# Patient Record
Sex: Female | Born: 1955 | Race: Black or African American | Hispanic: No | Smoking: Never smoker
Health system: Southern US, Community
[De-identification: ages and names within clinical notes are randomized; demographics above are authoritative.]

## PROBLEM LIST (undated history)

## (undated) DIAGNOSIS — E119 Type 2 diabetes mellitus without complications: Secondary | ICD-10-CM

## (undated) DIAGNOSIS — I10 Essential (primary) hypertension: Secondary | ICD-10-CM

## (undated) DIAGNOSIS — E785 Hyperlipidemia, unspecified: Secondary | ICD-10-CM

## (undated) HISTORY — PX: CHOLECYSTECTOMY: SHX55

---

## 1999-10-11 ENCOUNTER — Encounter: Payer: Self-pay | Admitting: Family Medicine

## 1999-10-11 ENCOUNTER — Ambulatory Visit (HOSPITAL_COMMUNITY): Admission: RE | Admit: 1999-10-11 | Discharge: 1999-10-11 | Payer: Self-pay | Admitting: Family Medicine

## 2000-08-31 ENCOUNTER — Ambulatory Visit (HOSPITAL_COMMUNITY): Admission: RE | Admit: 2000-08-31 | Discharge: 2000-08-31 | Payer: Self-pay | Admitting: Family Medicine

## 2000-08-31 ENCOUNTER — Encounter: Payer: Self-pay | Admitting: Family Medicine

## 2000-09-24 ENCOUNTER — Other Ambulatory Visit: Admission: RE | Admit: 2000-09-24 | Discharge: 2000-09-24 | Payer: Self-pay | Admitting: Family Medicine

## 2000-10-21 ENCOUNTER — Ambulatory Visit (HOSPITAL_COMMUNITY): Admission: RE | Admit: 2000-10-21 | Discharge: 2000-10-21 | Payer: Self-pay | Admitting: Family Medicine

## 2000-10-21 ENCOUNTER — Encounter: Payer: Self-pay | Admitting: Family Medicine

## 2001-10-28 ENCOUNTER — Ambulatory Visit (HOSPITAL_COMMUNITY): Admission: RE | Admit: 2001-10-28 | Discharge: 2001-10-28 | Payer: Self-pay | Admitting: Family Medicine

## 2001-10-28 ENCOUNTER — Encounter: Payer: Self-pay | Admitting: Family Medicine

## 2001-11-27 ENCOUNTER — Other Ambulatory Visit: Admission: RE | Admit: 2001-11-27 | Discharge: 2001-11-27 | Payer: Self-pay | Admitting: Family Medicine

## 2002-11-10 ENCOUNTER — Ambulatory Visit (HOSPITAL_COMMUNITY): Admission: RE | Admit: 2002-11-10 | Discharge: 2002-11-10 | Payer: Self-pay | Admitting: Family Medicine

## 2002-11-10 ENCOUNTER — Encounter: Payer: Self-pay | Admitting: Family Medicine

## 2002-12-30 ENCOUNTER — Encounter: Payer: Self-pay | Admitting: Family Medicine

## 2002-12-30 ENCOUNTER — Encounter: Admission: RE | Admit: 2002-12-30 | Discharge: 2002-12-30 | Payer: Self-pay | Admitting: Family Medicine

## 2002-12-30 ENCOUNTER — Encounter (INDEPENDENT_AMBULATORY_CARE_PROVIDER_SITE_OTHER): Payer: Self-pay | Admitting: Specialist

## 2003-01-01 HISTORY — PX: BREAST BIOPSY: SHX20

## 2003-05-05 ENCOUNTER — Other Ambulatory Visit: Admission: RE | Admit: 2003-05-05 | Discharge: 2003-05-05 | Payer: Self-pay | Admitting: Family Medicine

## 2003-11-30 ENCOUNTER — Ambulatory Visit (HOSPITAL_COMMUNITY): Admission: RE | Admit: 2003-11-30 | Discharge: 2003-11-30 | Payer: Self-pay | Admitting: Family Medicine

## 2003-12-10 ENCOUNTER — Encounter: Admission: RE | Admit: 2003-12-10 | Discharge: 2003-12-10 | Payer: Self-pay | Admitting: Family Medicine

## 2004-11-08 ENCOUNTER — Other Ambulatory Visit: Admission: RE | Admit: 2004-11-08 | Discharge: 2004-11-08 | Payer: Self-pay | Admitting: Family Medicine

## 2005-01-02 ENCOUNTER — Ambulatory Visit (HOSPITAL_COMMUNITY): Admission: RE | Admit: 2005-01-02 | Discharge: 2005-01-02 | Payer: Self-pay | Admitting: Family Medicine

## 2005-09-27 ENCOUNTER — Encounter (INDEPENDENT_AMBULATORY_CARE_PROVIDER_SITE_OTHER): Payer: Self-pay | Admitting: Specialist

## 2005-09-27 ENCOUNTER — Ambulatory Visit (HOSPITAL_COMMUNITY): Admission: EM | Admit: 2005-09-27 | Discharge: 2005-09-28 | Payer: Self-pay | Admitting: Emergency Medicine

## 2006-01-03 ENCOUNTER — Ambulatory Visit (HOSPITAL_COMMUNITY): Admission: RE | Admit: 2006-01-03 | Discharge: 2006-01-03 | Payer: Self-pay | Admitting: Family Medicine

## 2006-05-23 ENCOUNTER — Other Ambulatory Visit: Admission: RE | Admit: 2006-05-23 | Discharge: 2006-05-23 | Payer: Self-pay | Admitting: Family Medicine

## 2007-01-06 ENCOUNTER — Encounter: Admission: RE | Admit: 2007-01-06 | Discharge: 2007-01-06 | Payer: Self-pay | Admitting: Family Medicine

## 2007-04-30 ENCOUNTER — Encounter: Admission: RE | Admit: 2007-04-30 | Discharge: 2007-04-30 | Payer: Self-pay | Admitting: Family Medicine

## 2008-01-06 ENCOUNTER — Ambulatory Visit (HOSPITAL_COMMUNITY): Admission: RE | Admit: 2008-01-06 | Discharge: 2008-01-06 | Payer: Self-pay | Admitting: Family Medicine

## 2009-01-06 ENCOUNTER — Ambulatory Visit (HOSPITAL_COMMUNITY): Admission: RE | Admit: 2009-01-06 | Discharge: 2009-01-06 | Payer: Self-pay | Admitting: Family Medicine

## 2010-04-04 ENCOUNTER — Ambulatory Visit (HOSPITAL_COMMUNITY): Admission: RE | Admit: 2010-04-04 | Discharge: 2010-04-04 | Payer: Self-pay | Admitting: Family Medicine

## 2010-11-12 ENCOUNTER — Encounter: Payer: Self-pay | Admitting: Family Medicine

## 2010-12-27 ENCOUNTER — Other Ambulatory Visit (HOSPITAL_COMMUNITY): Payer: Self-pay | Admitting: Family Medicine

## 2010-12-27 DIAGNOSIS — Z1231 Encounter for screening mammogram for malignant neoplasm of breast: Secondary | ICD-10-CM

## 2011-03-09 NOTE — H&P (Signed)
NAMEFELISIA, BALCOM          ACCOUNT NO.:  [id number]   MEDICAL RECORD NO.:  [id number]          PATIENT TYPE:  EMS   LOCATION:  ED                           FACILITY:  [facility]   PHYSICIAN:  Ollen Gross. Vernell Morgans, M.D. DATE OF BIRTH:  1956/09/02   DATE OF ADMISSION:  09/27/2005  DATE OF DISCHARGE:                                HISTORY & PHYSICAL   HISTORY OF PRESENT ILLNESS:  [name] is a 55 year old black female who  began having severe right upper quadrant pain last night.  This has been  associated with significant nausea and vomiting.  Her pain has subsided  since getting pain medications, but was severe in nature.  She has had  multiple episodes of this year for the last 10 years.  She has felt that she  has had stones in her gallbladder for the last 10 years.  She otherwise  denies any chest pain, shortness of breath, fevers, chills, diarrhea,  dysuria.  The rest of her review of systems is unremarkable.   PAST MEDICAL HISTORY:  Significant for hypertension, hypercholesterolemia.   PAST SURGICAL HISTORY:  Significant for C-section.   MEDICATIONS:  Diovan and Zyrtec.   ALLERGIES:  No known drug allergies.   SOCIAL HISTORY:  Denies use of alcohol or tobacco products.   FAMILY HISTORY:  Noncontributory.   PHYSICAL EXAMINATION:  VITAL SIGNS:  Temperature 97.4, blood pressure  134/79, pulse 64.  GENERAL APPEARANCE:  She is well-developed, well-nourished black female in  no acute distress.  SKIN:  Warm and dry.  No jaundice.  HEENT:  Eyes:  Extraocular movements intact.  Pupils equal, round and  reactive to light.  Sclerae nonicteric.  LUNGS:  Clear bilaterally with no use of accessory or respiratory muscles.  HEART:  Regular rate and rhythm with an impulse on the left chest.  ABDOMEN:  Soft with some moderate right upper quadrant pain, but no guarding  or peritoneal signs.  No palpable mass or hepatosplenomegaly.  EXTREMITIES:  No cyanosis, clubbing or edema.  Good  strength in arms and  legs.  PSYCHOLOGICALLY:  Alert and oriented x3 with no evidence of anxiety or  depression.   LABORATORY DATA:  On review of her lab work, it was significant for normal  liver functions and normal white count.  Her ultrasound was reviewed and did  show some stones in her gallbladder and the dilated common bile duct.   ASSESSMENT/PLAN:  This is a 55 year old black female with what sounds like  very symptomatic gallstones.  I did think she would benefit from having her  gallbladder removed.  She would also have this done.  I explained to her  in detail the risks and benefits of the operation to remove the gallbladder  as well as some of the technical aspects, and she understands and wishes to  proceed.  She may have a retained common duct stone, but her liver function  is normal.  We will check a cholangiogram intraoperatively to look for this  and plan for surgery this afternoon.      Ollen Gross. Vernell Morgans, M.D.  Electronically Signed  PST/MEDQ  D:  09/27/2005  T:  09/27/2005  Job:  [id number]

## 2011-03-09 NOTE — Op Note (Signed)
Cheyenne Mcbride, Cheyenne Mcbride          ACCOUNT NO.:  [id number]   MEDICAL RECORD NO.:  [id number]          PATIENT TYPE:  INP   LOCATION:  0098                         FACILITY:  [facility]   PHYSICIAN:  Ollen Gross. Vernell Morgans, M.D. DATE OF BIRTH:  [birth year]   DATE OF PROCEDURE:  09/27/2005  DATE OF DISCHARGE:  09/28/2005                                 OPERATIVE REPORT   PREOPERATIVE DIAGNOSIS:  Cholecystitis and cholelithiasis.   POSTOPERATIVE DIAGNOSIS:  Cholecystitis and cholelithiasis.   PROCEDURE:  Laparoscopic cholecystectomy with intraoperative cholangiogram.   SURGEON:  Dr. Carolynne Edouard   ASSISTANT:  Dr. Jamey Ripa.   ANESTHESIA:  General endotracheal.   PROCEDURE:  After informed consent was obtained, the patient was brought to  the operating room and placed in a supine position on the operating room  table.  After adequate induction of general anesthesia, the patient's  abdomen was prepped with Betadine and draped in usual sterile manner.  The  area below the umbilicus was infiltrated with 0.25% Marcaine.  A small  incision was made with the 15 blade knife.  This incision was carried down  through the subcutaneous tissue bluntly with a hemostat and [employer]  retractors until the linea alba was identified.  The linea alba was incised  with a 15 blade knife, and each side was grasped with Kocher clamps and  elevated anteriorly.  The preperitoneal space was then probed bluntly with a  hemostat until the peritoneum was opened and access was gained to the  abdominal cavity.  A 0 Vicryl pursestring stitch was placed in the fascia  surrounding the opening.  Hasson cannula was placed through the opening and  anchored in place with the previously placed 0 Vicryl pursestring stitch.  The abdomen was then insufflated carbon dioxide without difficulty.  The  patient was placed in reverse Trendelenburg position and rotated slightly  with the right side up.  A laparoscope was inserted through the Hasson  cannula, and the right upper quadrant was inspected.  The dome of the  gallbladder and liver were readily identified.  Next, the epigastric region  was infiltrated with 0.25% Marcaine, and a small incision was made with a 15  blade knife, and a 10 meters port was placed bluntly through this incision  into the abdominal cavity under direct vision.  Sites were then chosen  laterally on the right side of the abdomen for placement of 5 mm ports.  Each of these areas was infiltrated with 0.25% Marcaine.  Small stab  incisions were made with the 15 blade knife, and 5 mm ports were placed  bluntly through these incisions into the abdominal cavity under direct  vision.  A blunt grasper was placed through the lateral-most 5 mm port and  used to grasp the dome of the gallbladder and elevated anteriorly and  superiorly.  Another blunt grasper was placed through the other 5 mm port  and used to retract on the body and neck of the gallbladder.  A dissector  was placed through the epigastric port and using the electrocautery, the  peritoneal reflection at the gallbladder neck was opened.  Blunt dissection  was then carried out in this area until the gallbladder neck/cystic duct  junction was readily identified and a good window was created.  A single  clip was placed on the gallbladder neck.  A small ductotomy was made with  the laparoscopic scissors just below the clip.  A 14 gauge Angiocath was  placed percutaneously through the anterior abdominal wall under direct  vision.  A Reddick cholangiogram catheter was then placed through the  Angiocath and flushed.  The Reddick catheter was then placed within the  cystic duct and anchored in place with the clip.  The cholangiogram was  obtained that showed no filling defects, good emptying into the duodenum,  and adequate length on the cystic duct.  The anchoring clip and catheters  were then removed from the patient.  Three clips were placed proximally on   the cystic duct, and the duct was divided between the 2 sets of clips.  Posterior to this, the cystic artery was identified and again dissected  bluntly in a circumferential manner until a good window was created.  Two  clips were placed proximally and one distally on the artery, and the artery  was divided between the two.  Next, a laparoscopic hook cautery device was  used to separate the gallbladder from the liver bed.  Prior to completely  detaching the gallbladder from the liver bed, the liver bed was inspected,  and several small bleeding points were coagulated with the electrocautery  until the area was completely hemostatic.  The gallbladder was then detached  the rest of the way from the liver bed without difficulty.  The laparoscopic  bag was inserted through the epigastric port, and the gallbladder was placed  within the bag, and the bag was sealed.  Next, a laparoscope was moved to  the epigastric port, and a gallbladder grasper was placed through the Hasson  cannula and used to grasp the opening of the bag.  The bag with the  gallbladder was removed through the infraumbilical port with the Hasson  cannula without difficulty.  The fascial defect was closed with the  previously placed Vicryl pursestring stitch as well as with another figure-  of-eight 0 Vicryl stitch.  The abdomen was then irrigated with copious  amounts of saline until the effluent was clear.  The liver bed was inspected  again and found to be hemostatic.  At this point, the rest of the ports were  removed under direct vision, and all were found to be hemostatic.  The gas  was allowed to escape.  Skin incisions were all closed with interrupted 4-0  Monocryl subcuticular stitches.  Benzoin and Steri-Strips were applied.  The  patient tolerated the procedure well.  At the end of the case, all needle,  sponge, and instruments counts were correct.  The patient was then awakened, taken to the recovery room in stable  condition.      Ollen Gross. Vernell Morgans, M.D.  Electronically Signed     PST/MEDQ  D:  10/01/2005  T:  10/01/2005  Job:  [id number]

## 2011-04-10 ENCOUNTER — Ambulatory Visit (HOSPITAL_COMMUNITY)
Admission: RE | Admit: 2011-04-10 | Discharge: 2011-04-10 | Disposition: A | Discharge status: Home / Self Care | Payer: BC Managed Care – PPO | Source: Ambulatory Visit | Attending: Family Medicine | Admitting: Family Medicine

## 2011-04-10 DIAGNOSIS — Z1231 Encounter for screening mammogram for malignant neoplasm of breast: Secondary | ICD-10-CM

## 2011-04-13 ENCOUNTER — Other Ambulatory Visit: Payer: Self-pay | Admitting: Family Medicine

## 2011-04-13 DIAGNOSIS — R928 Other abnormal and inconclusive findings on diagnostic imaging of breast: Secondary | ICD-10-CM

## 2011-04-26 ENCOUNTER — Ambulatory Visit
Admission: RE | Admit: 2011-04-26 | Discharge: 2011-04-26 | Disposition: A | Discharge status: Home / Self Care | Payer: BC Managed Care – PPO | Source: Ambulatory Visit | Attending: Family Medicine | Admitting: Family Medicine

## 2011-04-26 DIAGNOSIS — R928 Other abnormal and inconclusive findings on diagnostic imaging of breast: Secondary | ICD-10-CM

## 2012-05-15 ENCOUNTER — Other Ambulatory Visit (HOSPITAL_COMMUNITY): Payer: Self-pay | Admitting: Family Medicine

## 2012-05-15 DIAGNOSIS — Z1231 Encounter for screening mammogram for malignant neoplasm of breast: Secondary | ICD-10-CM

## 2012-06-03 ENCOUNTER — Ambulatory Visit (HOSPITAL_COMMUNITY)
Admission: RE | Admit: 2012-06-03 | Discharge: 2012-06-03 | Disposition: A | Discharge status: Home / Self Care | Payer: Self-pay | Source: Ambulatory Visit | Attending: Family Medicine | Admitting: Family Medicine

## 2012-06-03 DIAGNOSIS — Z1231 Encounter for screening mammogram for malignant neoplasm of breast: Secondary | ICD-10-CM

## 2013-05-21 ENCOUNTER — Other Ambulatory Visit (HOSPITAL_COMMUNITY): Payer: Self-pay | Admitting: Family Medicine

## 2013-05-21 DIAGNOSIS — Z1231 Encounter for screening mammogram for malignant neoplasm of breast: Secondary | ICD-10-CM

## 2013-06-04 ENCOUNTER — Ambulatory Visit (HOSPITAL_COMMUNITY)
Admission: RE | Admit: 2013-06-04 | Discharge: 2013-06-04 | Disposition: A | Discharge status: Home / Self Care | Payer: Self-pay | Source: Ambulatory Visit | Attending: Family Medicine | Admitting: Family Medicine

## 2013-06-04 DIAGNOSIS — Z1231 Encounter for screening mammogram for malignant neoplasm of breast: Secondary | ICD-10-CM

## 2013-10-31 ENCOUNTER — Emergency Department (HOSPITAL_COMMUNITY)
Admission: EM | Admit: 2013-10-31 | Discharge: 2013-10-31 | Disposition: A | Discharge status: Home / Self Care | Payer: BC Managed Care – PPO | Attending: Emergency Medicine | Admitting: Emergency Medicine

## 2013-10-31 ENCOUNTER — Encounter (HOSPITAL_COMMUNITY): Payer: Self-pay | Admitting: Emergency Medicine

## 2013-10-31 DIAGNOSIS — R040 Epistaxis: Secondary | ICD-10-CM | POA: Insufficient documentation

## 2013-10-31 DIAGNOSIS — J029 Acute pharyngitis, unspecified: Secondary | ICD-10-CM | POA: Insufficient documentation

## 2013-10-31 DIAGNOSIS — R059 Cough, unspecified: Secondary | ICD-10-CM | POA: Insufficient documentation

## 2013-10-31 DIAGNOSIS — I10 Essential (primary) hypertension: Secondary | ICD-10-CM | POA: Insufficient documentation

## 2013-10-31 DIAGNOSIS — Z9119 Patient's noncompliance with other medical treatment and regimen: Secondary | ICD-10-CM | POA: Insufficient documentation

## 2013-10-31 DIAGNOSIS — E119 Type 2 diabetes mellitus without complications: Secondary | ICD-10-CM | POA: Insufficient documentation

## 2013-10-31 DIAGNOSIS — R05 Cough: Secondary | ICD-10-CM | POA: Insufficient documentation

## 2013-10-31 DIAGNOSIS — Z91199 Patient's noncompliance with other medical treatment and regimen due to unspecified reason: Secondary | ICD-10-CM | POA: Insufficient documentation

## 2013-10-31 HISTORY — DX: Type 2 diabetes mellitus without complications: E11.9

## 2013-10-31 HISTORY — DX: Essential (primary) hypertension: I10

## 2013-10-31 MED ORDER — OXYMETAZOLINE HCL 0.05 % NA SOLN
2.0000 | Freq: Once | NASAL | Status: AC
  Administered 2013-10-31: 2 via NASAL
  Filled 2013-10-31: qty 15

## 2013-10-31 NOTE — ED Provider Notes (Signed)
CSN: [id number]     Arrival date & time 10/31/13  2101 History   First MD Initiated Contact with Patient 10/31/13 2136     Chief Complaint  Patient presents with  . Epistaxis   (Consider location/radiation/quality/duration/timing/severity/associated sxs/prior Treatment) HPI Comments: Patient with history of hypertension and noncompliant with her medications, no use of blood thinners -- presents with complaint of intermittent nosebleed for the past 3 days. Patient has had several nosebleeds per day. She does not usually get these. Parents has currently been bleeding for approximately one hour. Patient has not been applying pressure at home. She has had recent URI symptoms. Onset of symptoms gradual. Course is intermittent.  Patient is a 58 y.o. female presenting with nosebleeds. The history is provided by the patient.  Epistaxis Associated symptoms: congestion, cough and sore throat   Associated symptoms: no fever     Past Medical History  Diagnosis Date  . Hypertension   . Diabetes mellitus without complication    Past Surgical History  Procedure Laterality Date  . Cholecystectomy    . Cesarean section     No family history on file. History  Substance Use Topics  . Smoking status: Never Smoker   . Smokeless tobacco: Not on file  . Alcohol Use: No   OB History   Grav Para Term Preterm Abortions TAB SAB Ect Mult Living                 Review of Systems  Constitutional: Negative for fever.  HENT: Positive for congestion, nosebleeds and sore throat.   Respiratory: Positive for cough.     Allergies  Review of patient's allergies indicates no known allergies.  Home Medications  No current outpatient prescriptions on file. BP 155/90  Pulse 80  Temp(Src) 98.5 F (36.9 C) (Oral)  Resp 20  Wt 210 lb (95.255 kg)  SpO2 94% Physical Exam  Nursing note and vitals reviewed. Constitutional: She appears well-developed and well-nourished.  HENT:  Head: Normocephalic and  atraumatic.  Dark red blood from L nare. Once bleeding controlled, general edema noted but no bleeding site bilaterally.   Eyes: Conjunctivae are normal.  Neck: Normal range of motion. Neck supple.  Pulmonary/Chest: No respiratory distress.  Neurological: She is alert.  Skin: Skin is warm and dry.  Psychiatric: She has a normal mood and affect.    ED Course  Procedures (including critical care time) Labs Review Labs Reviewed - No data to display Imaging Review No results found.  EKG Interpretation   None      10:02 PM Patient seen and examined. Work-up initiated. Medications ordered. Patient instructed to hold pressure.   Vital signs reviewed and are as follows: Filed Vitals:   10/31/13 2118  BP: 155/90  Pulse: 80  Temp: 98.5 F (36.9 C)  Resp: 20   11:17 PM Bleeding controlled. Pt instructed on what to do if nosebleed occurs again. Patient verbalizes understanding and agrees with plan.   MDM   1. Epistaxis    Patient with nosebleeds, controlled in ED with Afrin and pressure. Return instructions given. Do not suspect significant blood loss.     Renne CriglerJoshua Izac Faulkenberry, PA-C 11/01/13 0001

## 2013-10-31 NOTE — ED Notes (Addendum)
Pt reports intermittent nosebleed x 2 days. Pt reports having nasal/cough/cold/congestion x 1 month. Small amount active bleeding at this time. Pt is no longer taking HTN DM due to no insurance

## 2013-10-31 NOTE — Discharge Instructions (Signed)
Please read and follow all provided instructions.  Your diagnoses today include:  1. Epistaxis     Tests performed today include:  Vital signs. See below for your results today.   Medications prescribed:   Oxymetazoline - nasal spray for congestion. Do not use for more than 3 days because this medicine can cause rebound congestion.   Home care instructions:  Read the educational materials provided and follow any instructions contained in this packet.  If your nosebleed happens again: Pinch your nose and hold for 15 minutes without letting go.  If this does not stop the bleeding, pinch and hold for another 15 minutes.  If it continues to bleed after this, please come to the Emergency Department or see your family doctor.   Follow-up instructions: Please follow-up with your primary care provider in the next 3 days for further evaluation of your symptoms. If you do not have a primary care doctor -- see below for referral information.   Return instructions:   Please return to the Emergency Department if you experience worsening symptoms.   Please return if you have any other emergent concerns.  Additional Information:  Your vital signs today were: BP 149/76   Pulse 80   Temp(Src) 98.5 F (36.9 C) (Oral)   Resp 21   Wt 210 lb (95.255 kg)   SpO2 94% If your blood pressure (BP) was elevated above 135/85 this visit, please have this repeated by your doctor within one month. --------------

## 2013-11-04 NOTE — ED Provider Notes (Signed)
Medical screening examination/treatment/procedure(s) were performed by non-physician practitioner and as supervising physician I was immediately available for consultation/collaboration.  Costella Schwarz T Swan [place], MD 11/04/13 1937 

## 2014-06-04 ENCOUNTER — Other Ambulatory Visit (HOSPITAL_COMMUNITY): Payer: Self-pay | Admitting: Nurse Practitioner

## 2014-06-04 DIAGNOSIS — Z1231 Encounter for screening mammogram for malignant neoplasm of breast: Secondary | ICD-10-CM

## 2014-06-16 ENCOUNTER — Ambulatory Visit (HOSPITAL_COMMUNITY)
Admission: RE | Admit: 2014-06-16 | Discharge: 2014-06-16 | Disposition: A | Discharge status: Home / Self Care | Payer: BC Managed Care – PPO | Source: Ambulatory Visit | Attending: Nurse Practitioner | Admitting: Nurse Practitioner

## 2014-06-16 DIAGNOSIS — Z1231 Encounter for screening mammogram for malignant neoplasm of breast: Secondary | ICD-10-CM

## 2015-05-06 ENCOUNTER — Other Ambulatory Visit: Payer: Self-pay | Admitting: Nurse Practitioner

## 2015-05-19 ENCOUNTER — Other Ambulatory Visit: Payer: Self-pay | Admitting: Nurse Practitioner

## 2015-05-19 DIAGNOSIS — Z1231 Encounter for screening mammogram for malignant neoplasm of breast: Secondary | ICD-10-CM

## 2015-06-20 ENCOUNTER — Ambulatory Visit (HOSPITAL_COMMUNITY)
Admission: RE | Admit: 2015-06-20 | Discharge: 2015-06-20 | Disposition: A | Discharge status: Home / Self Care | Payer: Self-pay | Source: Ambulatory Visit | Attending: Nurse Practitioner | Admitting: Nurse Practitioner

## 2015-06-20 DIAGNOSIS — Z1231 Encounter for screening mammogram for malignant neoplasm of breast: Secondary | ICD-10-CM

## 2015-06-28 ENCOUNTER — Encounter (HOSPITAL_COMMUNITY): Payer: Self-pay | Admitting: Emergency Medicine

## 2015-06-28 ENCOUNTER — Emergency Department (HOSPITAL_COMMUNITY)
Admission: EM | Admit: 2015-06-28 | Discharge: 2015-06-28 | Disposition: A | Discharge status: Home / Self Care | Payer: Self-pay | Attending: Emergency Medicine | Admitting: Emergency Medicine

## 2015-06-28 DIAGNOSIS — K0889 Other specified disorders of teeth and supporting structures: Secondary | ICD-10-CM

## 2015-06-28 DIAGNOSIS — J3489 Other specified disorders of nose and nasal sinuses: Secondary | ICD-10-CM | POA: Insufficient documentation

## 2015-06-28 DIAGNOSIS — K088 Other specified disorders of teeth and supporting structures: Secondary | ICD-10-CM | POA: Insufficient documentation

## 2015-06-28 DIAGNOSIS — E119 Type 2 diabetes mellitus without complications: Secondary | ICD-10-CM | POA: Insufficient documentation

## 2015-06-28 DIAGNOSIS — K029 Dental caries, unspecified: Secondary | ICD-10-CM | POA: Insufficient documentation

## 2015-06-28 DIAGNOSIS — K002 Abnormalities of size and form of teeth: Secondary | ICD-10-CM | POA: Insufficient documentation

## 2015-06-28 DIAGNOSIS — I10 Essential (primary) hypertension: Secondary | ICD-10-CM | POA: Insufficient documentation

## 2015-06-28 MED ORDER — IBUPROFEN 600 MG PO TABS: 600.0000 mg | ORAL_TABLET | Freq: Four times a day (QID) | ORAL | Status: DC | PRN

## 2015-06-28 MED ORDER — ACETAMINOPHEN 325 MG PO TABS
650.0000 mg | ORAL_TABLET | Freq: Once | ORAL | Status: AC
  Administered 2015-06-28: 650 mg via ORAL
  Filled 2015-06-28: qty 2

## 2015-06-28 MED ORDER — LORATADINE 10 MG PO TABS: 10.0000 mg | ORAL_TABLET | Freq: Every day | ORAL | Status: AC

## 2015-06-28 MED ORDER — LORATADINE 10 MG PO TABS
10.0000 mg | ORAL_TABLET | Freq: Once | ORAL | Status: AC
  Administered 2015-06-28: 10 mg via ORAL
  Filled 2015-06-28: qty 1

## 2015-06-28 MED ORDER — ACETAMINOPHEN 500 MG PO TABS: 500.0000 mg | ORAL_TABLET | Freq: Four times a day (QID) | ORAL | Status: DC | PRN

## 2015-06-28 MED ORDER — IBUPROFEN 400 MG PO TABS
600.0000 mg | ORAL_TABLET | Freq: Once | ORAL | Status: AC
  Administered 2015-06-28: 600 mg via ORAL
  Filled 2015-06-28: qty 2

## 2015-06-28 NOTE — ED Provider Notes (Signed)
CSN: [id number]     Arrival date & time 06/28/15  2020 History  This chart was scribed for Cheri Fowler, PA-C, working with Marily Memos, MD by Elon Spanner, ED Scribe. This patient was seen in room TR07C/TR07C and the patient's care was started at 8:39 PM.   Chief Complaint  Patient presents with  . Dental Pain  . Nasal Congestion   The history is provided by the patient. No language interpreter was used.   HPI Comments: Cheyenne Mcbride is a 59 y.o. female who presents to the Emergency Department complaining of constant, throbbing, intermittent, 10/10 currently, right-sided dental pain onset 3 years ago.  Over the past 3 weeks, the complaint has recurred at a higher frequency than normal and the patient was prompted to come to the ED due to pain.  Associated symptoms include right ear pain and right-sided facial pain that always occur concurrently with the dental pain.  The patient reports she has been seen by the [facility] multiple times and prescribed amoxicillin and magic mouth wash with several refills of both and instructions to start a course with symptom recurrence until she can be seen by a dentist (Pt has orange card and is on waiting list for dentist; she is currently taking the amoxicillin).  She has also used Tylenol, BC Powder, Anbesol gel, ice and other unknown OTC medications for pain with transient relief.   Patient also reports the onset of congestion, a dry cough, voice hoarseness and clear drainage from the right eye yesterday while outdoors.  She has used Mucinex with transient relief.  She denies hx of allergies.  She denies fever, cough, bloody nose, headaches, n/v, CP, SOB, dysphagia, odynophagia.   Past Medical History  Diagnosis Date  . Hypertension   . Diabetes mellitus without complication    Past Surgical History  Procedure Laterality Date  . Cholecystectomy    . Cesarean section     No family history on file. Social History  Substance Use Topics   . Smoking status: Never Smoker   . Smokeless tobacco: None  . Alcohol Use: No   OB History    No data available     Review of Systems All other systems negative unless otherwise stated in HPI    Allergies  Review of patient's allergies indicates no known allergies.  Home Medications   Prior to Admission medications   Medication Sig Start Date End Date Taking? Authorizing Provider  Aspirin-Caffeine (BAYER BACK & BODY PAIN EX ST) 500-32.5 MG TABS Take 1 tablet by mouth 2 (two) times daily as needed.    Historical Provider, MD   BP 160/85 mmHg  Pulse 69  Temp(Src) 98.5 F (36.9 C) (Oral)  Resp 14  Ht 5\' 5"  (1.651 m)  Wt 220 lb (99.791 kg)  BMI 36.61 kg/m2  SpO2 98% Physical Exam  Constitutional: She is oriented to person, place, and time. She appears well-developed and well-nourished. No distress.  HENT:  Head: Normocephalic and atraumatic.  Right Ear: Hearing, tympanic membrane, external ear and ear canal normal. No drainage.  Left Ear: Hearing, tympanic membrane, external ear and ear canal normal. No drainage.  Nose: Rhinorrhea present. No mucosal edema or septal deviation. No epistaxis.  Mouth/Throat: Uvula is midline, oropharynx is clear and moist and mucous membranes are normal. No oral lesions. No trismus in the jaw. Abnormal dentition. Dental caries present. No dental abscesses. No oropharyngeal exudate.  Eyes: Conjunctivae and EOM are normal. Pupils are equal, round, and reactive  to light.  Neck: Normal range of motion. Neck supple. No tracheal deviation present.  No Ludwig's Angina.   Cardiovascular: Normal rate, regular rhythm and normal heart sounds.   Pulmonary/Chest: Effort normal and breath sounds normal. No respiratory distress.  Abdominal: Soft. Bowel sounds are normal. She exhibits no distension.  Musculoskeletal: Normal range of motion.  Lymphadenopathy:    She has no cervical adenopathy.  Neurological: She is alert and oriented to person, place, and  time.  Skin: Skin is warm and dry.  Psychiatric: She has a normal mood and affect. Her behavior is normal.  Nursing note and vitals reviewed.   ED Course  Procedures (including critical care time)  DIAGNOSTIC STUDIES: Oxygen Saturation is 98% on RA, normal by my interpretation.    COORDINATION OF CARE:  8:56 PM Discussed treatment plan with patient at bedside.  Patient acknowledges and agrees with plan.    Labs Review Labs Reviewed - No data to display  Imaging Review No results found. I have personally reviewed and evaluated these images and lab results as part of my medical decision-making.   EKG Interpretation None      MDM   Final diagnoses:  None    Pt with 3 year hx of right upper dental pain followed by [facility] presenting with worsening dental pain and new onset rhinorrhea.  VSS.  On exam, dental caries noted, no drainage, pus, or erythema present.  Symptom control in ED.  Pt on waiting list with orange card for establishing dental care. Currently taking Amoxicillin prescribed by PCP, so will not alter abx regimen.  Pt to follow up with PCP/dentist or return here if symptoms worsen.    Cheri Fowler, PA-C 06/28/15 2149  Marily Memos, MD 06/29/15 [phone]

## 2015-06-28 NOTE — ED Notes (Signed)
Pt. Reports chronic  right upper molar pain and sinus congestion onset yesterday .

## 2015-06-28 NOTE — ED Notes (Signed)
Kayla Pa at bedside 

## 2015-06-28 NOTE — Discharge Instructions (Signed)

## 2015-08-19 LAB — GLUCOSE, POCT (MANUAL RESULT ENTRY): POC GLUCOSE: 104 mg/dL — AB (ref 70–99)

## 2016-05-15 ENCOUNTER — Other Ambulatory Visit: Payer: Self-pay | Admitting: Nurse Practitioner

## 2016-08-29 ENCOUNTER — Other Ambulatory Visit: Payer: Self-pay | Admitting: Nurse Practitioner

## 2016-08-29 DIAGNOSIS — Z1231 Encounter for screening mammogram for malignant neoplasm of breast: Secondary | ICD-10-CM

## 2016-08-31 ENCOUNTER — Ambulatory Visit
Admission: RE | Admit: 2016-08-31 | Discharge: 2016-08-31 | Disposition: A | Discharge status: Home / Self Care | Payer: No Typology Code available for payment source | Source: Ambulatory Visit | Attending: Nurse Practitioner | Admitting: Nurse Practitioner

## 2016-08-31 DIAGNOSIS — Z1231 Encounter for screening mammogram for malignant neoplasm of breast: Secondary | ICD-10-CM

## 2016-12-17 ENCOUNTER — Encounter (HOSPITAL_COMMUNITY): Payer: Self-pay

## 2017-10-07 ENCOUNTER — Other Ambulatory Visit: Payer: Self-pay | Admitting: Nurse Practitioner

## 2017-10-07 DIAGNOSIS — Z1231 Encounter for screening mammogram for malignant neoplasm of breast: Secondary | ICD-10-CM

## 2017-12-25 ENCOUNTER — Other Ambulatory Visit: Payer: Self-pay

## 2017-12-25 DIAGNOSIS — Z1231 Encounter for screening mammogram for malignant neoplasm of breast: Secondary | ICD-10-CM

## 2018-05-13 ENCOUNTER — Other Ambulatory Visit: Payer: Self-pay | Admitting: Obstetrics and Gynecology

## 2018-05-13 DIAGNOSIS — Z1231 Encounter for screening mammogram for malignant neoplasm of breast: Secondary | ICD-10-CM

## 2018-07-03 ENCOUNTER — Ambulatory Visit
Admission: RE | Admit: 2018-07-03 | Discharge: 2018-07-03 | Disposition: A | Discharge status: Home / Self Care | Payer: No Typology Code available for payment source | Source: Ambulatory Visit | Attending: Obstetrics and Gynecology | Admitting: Obstetrics and Gynecology

## 2018-07-03 ENCOUNTER — Ambulatory Visit (HOSPITAL_COMMUNITY)
Admission: RE | Admit: 2018-07-03 | Discharge: 2018-07-03 | Disposition: A | Discharge status: Home / Self Care | Payer: Self-pay | Source: Ambulatory Visit | Attending: Obstetrics and Gynecology | Admitting: Obstetrics and Gynecology

## 2018-07-03 ENCOUNTER — Encounter (HOSPITAL_COMMUNITY): Payer: Self-pay | Admitting: *Deleted

## 2018-07-03 VITALS — BP 124/82 | Ht 65.0 in | Wt 215.4 lb

## 2018-07-03 DIAGNOSIS — Z1231 Encounter for screening mammogram for malignant neoplasm of breast: Secondary | ICD-10-CM

## 2018-07-03 DIAGNOSIS — Z1239 Encounter for other screening for malignant neoplasm of breast: Secondary | ICD-10-CM

## 2018-07-03 HISTORY — DX: Hyperlipidemia, unspecified: E78.5

## 2018-07-03 NOTE — Patient Instructions (Signed)
Explained breast self awareness with Cheyenne Mcbride. Patient did not need a Pap smear today due to last Pap smear was 12/17/2016. Let her know [facility] will cover Pap smears every 3 years unless has a history of abnormal Pap smears. [facility]. Appointment scheduled for Thursday, July 03, 2018 at 0910. Let patient know the Breast Center will follow up with her within the next couple weeks with results of mammogram by letter or phone. Cheyenne Mcbride verbalized understanding.  [place], Cheyenne Maserhristine Poll, RN 8:31 AM

## 2018-07-03 NOTE — Progress Notes (Signed)
No complaints today.   Pap Smear: Pap smear not completed today. Last Pap smear was 12/17/2016 at the [facility] screening [facility] and normal. Per patient has no history of an abnormal Pap smear. Last Pap smear result is in Epic.  Physical exam: Breasts Breasts symmetrical. No skin abnormalities bilateral breasts. No nipple retraction bilateral breasts. No nipple discharge bilateral breasts. No lymphadenopathy. No lumps palpated bilateral breasts. No complaints of pain or tenderness on exam. [facility] mamm[facility]. Appointment scheduled for Thursday, July 03, 2018 at 0910.        Pelvic/Bimanual No Pap smear completed today since last Pap smear was 12/17/2016. Pap smear not indicated per [facility] guidelines.   Smoking History: Patient has never smoked.  Patient Navigation: Patient education provided. Access to services provided for patient through [facility] program.  Colorectal Cancer Screening: Per patient had a colonoscopy completed 11 years ago. Per patient completed a FIT Test in April 2019 that was negative. No complaints today.    Breast and Cervical Cancer Risk Assessment: Patient has no family history of breast cancer, known genetic mutations, or radiation treatment to the chest before age 61. Patient has no history of cervical dysplasia, immunocompromised, or DES exposure in-utero.  Risk Assessment    Risk Scores      07/03/2018   Last edited by: Lynnell DikeHolland, Sabrina H, LPN   5-year risk: 1.5 %   Lifetime risk: 6.7 %

## 2018-07-04 ENCOUNTER — Encounter (HOSPITAL_COMMUNITY): Payer: Self-pay | Admitting: *Deleted

## 2018-07-09 ENCOUNTER — Ambulatory Visit: Payer: No Typology Code available for payment source

## 2018-07-11 ENCOUNTER — Other Ambulatory Visit (HOSPITAL_COMMUNITY): Payer: Self-pay | Admitting: *Deleted

## 2018-07-11 ENCOUNTER — Inpatient Hospital Stay: Payer: PRIVATE HEALTH INSURANCE

## 2018-07-11 ENCOUNTER — Inpatient Hospital Stay: Payer: Self-pay | Attending: Obstetrics and Gynecology | Admitting: *Deleted

## 2018-07-11 VITALS — BP 120/72 | Ht 64.0 in | Wt 214.0 lb

## 2018-07-11 DIAGNOSIS — Z0000 Encounter for general adult medical examination without abnormal findings: Secondary | ICD-10-CM

## 2018-07-11 LAB — HEMOGLOBIN A1C
HEMOGLOBIN A1C: 5.8 % — AB (ref 4.8–5.6)
Mean Plasma Glucose: 119.76 mg/dL

## 2018-07-11 LAB — LIPID PANEL
Cholesterol: 171 mg/dL (ref 0–200)
HDL: 36 mg/dL — AB (ref >40)
LDL CALC: 111 mg/dL — AB (ref 0–99)
TRIGLYCERIDES: 118 mg/dL (ref <150)
Total CHOL/HDL Ratio: 4.8 RATIO
VLDL: 24 mg/dL (ref 0–40)

## 2018-07-11 NOTE — Progress Notes (Signed)
[facility] initial screening  Clinical Measurement:  Height: 64in   Weight:  214lb Blood Pressure:  128/78  Blood Pressure #2: 120/72   Fasting Labs Drawn Today, will review with patient when they result.  Medical History:  Patient states that she has not diagnosed with high cholesterol, high blood pressure and diabetes.  Patient states she has not been diagnosed with heart disease.  Medications:  Patients states she is  taking  medications for high cholesterol, high blood pressure and  diabetes.  She is not taking aspirin daily to prevent heart attack or stroke.    Blood pressure, self measurement:  Patients states she does measure blood pressure at home weekly.  Nutrition:  Patient states she eats 4 cups of fruit and 3 cups of vegetables in an average day.  Patient states she does not eat fish regularly, she eats more than half a serving of whole grains daily. She drinks less than 36 ounces of beverages with added sugar weekly.  She is currently watching her sodium intake.  She has not had any drinks containing alcohol in the last seven days.  Physical activity:  Patient states that she gets 180 minutes of moderate exercise in a week.  She gets 0 minutes of vigorous exercise per week.    Smoking status:  Patient states she has never smoked and is not around any smokers.      Quality of life:  Patient states that she has had 3 bad physical days out of the last 30 days. In the last 2 weeks, she has had 0 days that she has felt down or depressed. She has had 0 days in the last 2 weeks that she has had little interest or pleasure in doing things.  Risk reduction and counseling:  Patient states she wants to lose weight and increase fruit and vegetable intake.  I encouraged her to continue with current exercise regimen and increase vegetable and fruit intake.  Navigation:  I will notify patient of lab results.  Patient is aware of 2 more health coaching sessions and a follow up.

## 2018-07-11 NOTE — Addendum Note (Signed)
Addended by: Alvina FilbertHICKERSON, Dorthie Santini L on: 07/11/2018 11:59 AM   Modules accepted: Orders

## 2018-07-11 NOTE — Addendum Note (Signed)
Addended by: Chealsey Miyamoto L on: 07/11/2018 11:59 AM   Modules accepted: Orders  

## 2018-07-16 ENCOUNTER — Telehealth (HOSPITAL_COMMUNITY): Payer: Self-pay | Admitting: *Deleted

## 2018-07-16 NOTE — Telephone Encounter (Signed)
Health coaching 2  Labs- HDL cholesterol, LDL cholesterol, cholesterol 171, triglycerides 118, hemoglobin A1C  5.8,mean plasma glucose 119.76  Patient is aware and understands these results.  Goals-  Patient states she is wanting to lose weight and is walking in her neighborhood.  I encouraged patient to increase her exercise by 30 minutes weekly. I encouraged patient to eat fruits ,vegetables, lean meats, fish (salmon, mackerel & tuna) or skinless chicken and to decrease butter and fatty meats.  Navigation:  Patient is aware of 1 more health coaching sessions and a follow up.   Time- 10 minutes

## 2018-08-04 ENCOUNTER — Other Ambulatory Visit: Payer: Self-pay

## 2018-08-11 LAB — CYTOLOGY - PAP: DIAGNOSIS: NEGATIVE

## 2018-08-28 ENCOUNTER — Other Ambulatory Visit: Payer: Self-pay

## 2018-08-28 ENCOUNTER — Encounter (HOSPITAL_COMMUNITY): Payer: Self-pay

## 2018-08-28 ENCOUNTER — Emergency Department (HOSPITAL_COMMUNITY): Payer: PRIVATE HEALTH INSURANCE

## 2018-08-28 ENCOUNTER — Emergency Department (HOSPITAL_COMMUNITY)
Admission: EM | Admit: 2018-08-28 | Discharge: 2018-08-28 | Disposition: A | Discharge status: Home / Self Care | Payer: PRIVATE HEALTH INSURANCE | Attending: Emergency Medicine | Admitting: Emergency Medicine

## 2018-08-28 DIAGNOSIS — M5441 Lumbago with sciatica, right side: Secondary | ICD-10-CM | POA: Insufficient documentation

## 2018-08-28 DIAGNOSIS — E119 Type 2 diabetes mellitus without complications: Secondary | ICD-10-CM | POA: Insufficient documentation

## 2018-08-28 DIAGNOSIS — Z79899 Other long term (current) drug therapy: Secondary | ICD-10-CM | POA: Insufficient documentation

## 2018-08-28 DIAGNOSIS — I10 Essential (primary) hypertension: Secondary | ICD-10-CM | POA: Insufficient documentation

## 2018-08-28 NOTE — ED Triage Notes (Addendum)
Pt states that she has back and hip pain on her right side that comes and goes. Pt states that the pain in worst in the AM. Pt states that she is active, doing yardwork and playing with grandchildren. Pt states that she went and saw her PCP, who gave her a hormone shot. Pt states that she was given an [employer] fundraiser for tramadol, but it has not arrived yet. Pt states that she has been taking tylenol, but has had stomach issues. PCP sent paperwork for Lumbar XR and XR hip right

## 2018-08-28 NOTE — Discharge Instructions (Addendum)
Follow up with your primary care doctor.

## 2018-08-28 NOTE — ED Provider Notes (Signed)
[facility]-EMERGENCY DEPT Provider Note   CSN: [id number] Arrival date & time: 08/28/18  0865     History   Chief Complaint Chief Complaint  Patient presents with  . Back Pain    HPI Cheyenne Mcbride is a 62 y.o. female.  HPI Patient presents with right lower back pain that goes down to the hip and down the leg.  Has had for the last couple months.  Has been seen by her PCP and had a shot of steroids.  May have helped a little bit but pain is returned.  Pain is worse after using her leg.  States that she reports from 2 to 6 PM and after work it tends to hurt more.  No fall.  No fevers.  No loss of bladder or bowel control.  Has a prescription for tramadol but has not had the pills come from the mail order yet.  States that the primary care doctor put an order in for lumbar and hip x-ray.  No abdominal pain.  Not on anticoagulation. Past Medical History:  Diagnosis Date  . Diabetes mellitus without complication (HCC)   . Hyperlipidemia   . Hypertension     There are no active problems to display for this patient.   Past Surgical History:  Procedure Laterality Date  . CESAREAN SECTION     2 previous  . CHOLECYSTECTOMY       OB History    Gravida  4   Para      Term      Preterm      AB  2   Living  2     SAB  2   TAB      Ectopic      Multiple      Live Births  2            Home Medications    Prior to Admission medications   Medication Sig Start Date End Date Taking? Authorizing Provider  acetaminophen (TYLENOL) 500 MG tablet Take 1 tablet (500 mg total) by mouth every 6 (six) hours as needed. Patient taking differently: Take 500 mg by mouth 2 (two) times daily with a meal.  06/28/15  Yes Rose, Kayla, PA-C  Aspirin-Caffeine (BAYER BACK & BODY PAIN EX ST) 500-32.5 MG TABS Take 1 tablet by mouth 2 (two) times daily as needed (pain).    Yes [provider]  atorvastatin (LIPITOR) 10 MG tablet Take 10 mg by  mouth at bedtime.    Yes [provider]  Boswellia-Glucosamine-Vit D (GLUCOSAMINE COMPLEX PO) Take 1 capsule by mouth daily.   Yes [provider]  Cyanocobalamin (VITAMIN B-12 PO) Take 1 tablet by mouth daily.   Yes [provider]  hydrochlorothiazide (HYDRODIURIL) 25 MG tablet Take 25 mg by mouth daily.   Yes [provider]  loratadine (CLARITIN) 10 MG tablet Take 1 tablet (10 mg total) by mouth daily. Patient taking differently: Take 10 mg by mouth daily as needed for allergies.  06/28/15  Yes Cheri Fowler, PA-C  Omega-3 Fatty Acids (FISH OIL) 1000 MG CAPS Take 1,000 mg by mouth daily.   Yes [provider]  traMADol (ULTRAM) 50 MG tablet Take 50 mg by mouth every 12 (twelve) hours as needed for moderate pain.    [provider]    Family History Family History  Problem Relation Age of Onset  . Diabetes Mother   . Hypertension Mother   . Hypertension Father   .  Diabetes Brother   . Breast cancer Neg Hx     Social History Social History   Tobacco Use  . Smoking status: Never Smoker  . Smokeless tobacco: Never Used  Substance Use Topics  . Alcohol use: No  . Drug use: No     Allergies   Patient has no known allergies.   Review of Systems Review of Systems  Constitutional: Negative for appetite change.  Respiratory: Negative for shortness of breath.   Gastrointestinal: Negative for abdominal pain.  Endocrine: Negative for polyuria.  Genitourinary: Negative for flank pain.  Musculoskeletal: Positive for back pain.       Right hip pain.  Skin: Negative for rash.  Neurological: Negative for weakness.  Hematological: Negative for adenopathy.     Physical Exam Updated Vital Signs BP (!) 136/98   Pulse 67   Temp 98.1 F (36.7 C) (Oral)   Resp 16   Ht 5\' 5"  (1.651 m)   Wt 98.9 kg   LMP 04/26/2013   SpO2 100%   BMI 36.28 kg/m   Physical Exam  Constitutional: She appears well-developed.  HENT:  Head:  Normocephalic.  Cardiovascular: Normal rate.  Abdominal: She exhibits no mass. There is no tenderness.  Musculoskeletal: She exhibits tenderness.  Some mild tenderness in right SI area.  Some mild pain with straight leg raise on right side.  Neurovascularly intact distally.  Skin: Skin is warm. Capillary refill takes less than 2 seconds.  Psychiatric: She has a normal mood and affect.     ED Treatments / Results  Labs (all labs ordered are listed, but only abnormal results are displayed) Labs Reviewed - No data to display  EKG None  Radiology Dg Lumbar Spine Complete  Result Date: 08/28/2018 CLINICAL DATA:  Pain across lower back and into posterior right hip; no specific injury but patient lifts young granddaughter; patient had an injection 2 weeks ago w/ no relief. Pt states she can shift weight to left side with some relief EXAM: LUMBAR SPINE - COMPLETE 4+ VIEW COMPARISON:  None. FINDINGS: There is significant disc height loss at L4-5 and L5-S1. There is 4 millimeters anterolisthesis of L4 on L5. Lumbar facet hypertrophy is present. No suspicious lytic or blastic lesions are identified. No acute fracture. Bowel gas pattern is nonobstructed. Surgical clips are seen in the RIGHT UPPER QUADRANT the abdomen. IMPRESSION: Moderate lumbar spondylosis in the LOWER lumbar levels with grade 1 anterolisthesis of L4 on L5. Electronically Signed   By: Norva Pavlov M.D.   On: 08/28/2018 10:29   Dg Hip Unilat W Or Wo Pelvis 2-3 Views Right  Result Date: 08/28/2018 CLINICAL DATA:  Lower back and posterior right hip pain. EXAM: DG HIP (WITH OR WITHOUT PELVIS) 2-3V RIGHT COMPARISON:  None. FINDINGS: There is no evidence of hip fracture or dislocation. There is no evidence of arthropathy or other focal bone abnormality. IMPRESSION: Negative. Electronically Signed   By: Ted Mcalpine M.D.   On: 08/28/2018 10:28    Procedures Procedures (including critical care time)  Medications Ordered in  ED Medications - No data to display   Initial Impression / Assessment and Plan / ED Course  I have reviewed the triage vital signs and the nursing notes.  Pertinent labs & imaging results that were available during my care of the patient were reviewed by me and considered in my medical decision making (see chart for details).     Patient presents with back pain.  Has had for a while now.  X-ray shows some chronic back issues.  No acute injury.  Doubt malignancy.  Doubt intra-abdominal cause.  Discharged home to follow-up with PCP.  Final Clinical Impressions(s) / ED Diagnoses   Final diagnoses:  Bilateral low back pain with right-sided sciatica, unspecified chronicity    ED Discharge Orders    None       Benjiman Core, MD 08/28/18 1117

## 2018-09-22 ENCOUNTER — Telehealth: Payer: Self-pay

## 2018-09-22 NOTE — Telephone Encounter (Signed)
Call is regarding the 3rd health coaching session for [facility].

## 2018-09-24 ENCOUNTER — Telehealth: Payer: Self-pay

## 2020-04-13 ENCOUNTER — Other Ambulatory Visit: Payer: Self-pay

## 2020-04-13 DIAGNOSIS — Z1231 Encounter for screening mammogram for malignant neoplasm of breast: Secondary | ICD-10-CM

## 2020-04-26 ENCOUNTER — Ambulatory Visit
Admission: RE | Admit: 2020-04-26 | Discharge: 2020-04-26 | Disposition: A | Discharge status: Home / Self Care | Payer: No Typology Code available for payment source | Source: Ambulatory Visit | Attending: Family Medicine | Admitting: Family Medicine

## 2020-04-26 ENCOUNTER — Other Ambulatory Visit: Payer: Self-pay

## 2020-04-26 DIAGNOSIS — Z1231 Encounter for screening mammogram for malignant neoplasm of breast: Secondary | ICD-10-CM

## 2020-08-02 NOTE — Telephone Encounter (Signed)
Error

## 2021-07-03 ENCOUNTER — Other Ambulatory Visit: Payer: Self-pay

## 2021-07-03 DIAGNOSIS — Z1231 Encounter for screening mammogram for malignant neoplasm of breast: Secondary | ICD-10-CM

## 2021-07-06 ENCOUNTER — Other Ambulatory Visit: Payer: Self-pay | Admitting: Obstetrics and Gynecology

## 2021-07-06 DIAGNOSIS — Z1231 Encounter for screening mammogram for malignant neoplasm of breast: Secondary | ICD-10-CM

## 2021-08-24 ENCOUNTER — Other Ambulatory Visit: Payer: Self-pay

## 2021-08-24 ENCOUNTER — Ambulatory Visit
Admission: RE | Admit: 2021-08-24 | Discharge: 2021-08-24 | Disposition: A | Discharge status: Home / Self Care | Payer: No Typology Code available for payment source | Source: Ambulatory Visit | Attending: Obstetrics and Gynecology | Admitting: Obstetrics and Gynecology

## 2021-08-24 ENCOUNTER — Ambulatory Visit: Payer: Self-pay | Admitting: *Deleted

## 2021-08-24 VITALS — BP 136/90 | Wt 221.6 lb

## 2021-08-24 DIAGNOSIS — Z01419 Encounter for gynecological examination (general) (routine) without abnormal findings: Secondary | ICD-10-CM

## 2021-08-24 DIAGNOSIS — Z1231 Encounter for screening mammogram for malignant neoplasm of breast: Secondary | ICD-10-CM

## 2021-08-24 DIAGNOSIS — Z1211 Encounter for screening for malignant neoplasm of colon: Secondary | ICD-10-CM

## 2021-08-24 NOTE — Patient Instructions (Signed)
Explained breast self awareness with [name]. Pap smear completed today. Let her know [facility] will cover Pap smears and HPV typing every 5 years unless has a history of abnormal Pap smears. Referred patient to the [facility] for a screening mammogram on mobile unit. Appointment scheduled Thursday, August 24, 2021 at 1040. Patient escorted to the mobile unit following [facility] appointment for her screening mammogram. Let patient know will follow up with her within the next couple weeks with results of her Pap smear by phone. Informed patient that the Breast Center will follow-up with her within the next couple of weeks with results of her mammogram by letter or phone. Cheyenne Mcbride verbalized understanding.  [name], Kathaleen Maser, RN 9:16 AM

## 2021-08-24 NOTE — Progress Notes (Signed)
Ms. Jennifer Payes is a 65 y.o. [phone] female who presents to [facility] clinic today with no complaints.    Pap Smear: Pap smear completed today. Last Pap smear was 08/04/2018 at the free cervical cancer screening clinic and normal. Per patient has no history of an abnormal Pap smear. Last Pap smear result is available in Epic.   Physical exam: Breasts Breasts symmetrical. No skin abnormalities bilateral breasts. No nipple retraction bilateral breasts. No nipple discharge bilateral breasts. No lymphadenopathy. No lumps palpated bilateral breasts. No complaints of pain or tenderness on exam.  MS DIGITAL SCREENING BILATERAL  Result Date: 08/31/2016 CLINICAL DATA:  Screening. EXAM: DIGITAL SCREENING BILATERAL MAMMOGRAM WITH CAD COMPARISON:  Previous exam(s). ACR Breast Density Category b: There are scattered areas of fibroglandular density. FINDINGS: There are no findings suspicious for malignancy. Images were processed with CAD. IMPRESSION: No mammographic evidence of malignancy. A result letter of this screening mammogram will be mailed directly to the patient. RECOMMENDATION: Screening mammogram in one year. (Code:SM-B-01Y) BI-RADS CATEGORY  1: Negative. Electronically Signed   By: Beckie Salts M.D.   On: 09/03/2016 15:41   MS DIGITAL SCREENING TOMO BILATERAL  Result Date: 04/28/2020 CLINICAL DATA:  Screening. EXAM: DIGITAL SCREENING BILATERAL MAMMOGRAM WITH TOMO AND CAD COMPARISON:  Previous exam(s). ACR Breast Density Category b: There are scattered areas of fibroglandular density. FINDINGS: There are no findings suspicious for malignancy. Images were processed with CAD. IMPRESSION: No mammographic evidence of malignancy. A result letter of this screening mammogram will be mailed directly to the patient. RECOMMENDATION: Screening mammogram in one year. (Code:SM-B-01Y) BI-RADS CATEGORY  1: Negative. Electronically Signed   By: Harmon Pier M.D.   On: 04/28/2020 13:31   MS DIGITAL SCREENING  TOMO BILATERAL  Result Date: 07/03/2018 CLINICAL DATA:  Screening. EXAM: DIGITAL SCREENING BILATERAL MAMMOGRAM WITH TOMO AND CAD COMPARISON:  Previous exam(s). ACR Breast Density Category b: There are scattered areas of fibroglandular density. FINDINGS: There are no findings suspicious for malignancy. Images were processed with CAD. IMPRESSION: No mammographic evidence of malignancy. A result letter of this screening mammogram will be mailed directly to the patient. RECOMMENDATION: Screening mammogram in one year. (Code:SM-B-01Y) BI-RADS CATEGORY  1: Negative. Electronically Signed   By: Sherian Rein M.D.   On: 07/03/2018 08:44        Pelvic/Bimanual Ext Genitalia No lesions, no swelling and no discharge observed on external genitalia.        Vagina Vagina pink and normal texture. No lesions or discharge observed in vagina.        Cervix Cervix is present. Cervix pink and of normal texture. No discharge observed.    Uterus Uterus is present and palpable. Uterus in normal position and normal size.        Adnexae Bilateral ovaries present and palpable. No tenderness on palpation.         Rectovaginal No rectal exam completed today since patient had no rectal complaints. No skin abnormalities observed on exam.     Smoking History: Patient has never smoked.   Patient Navigation: Patient education provided. Access to services provided for patient through [facility] program.   Colorectal Cancer Screening: Per patient had a colonoscopy completed 14 years ago. FIT Test given to patient to complete. No complaints today.    Breast and Cervical Cancer Risk Assessment: Patient does not have family history of breast cancer, known genetic mutations, or radiation treatment to the chest before age 69. Patient does not have history of cervical dysplasia, immunocompromised, or  DES exposure in-utero.  Risk Assessment     Risk Scores       08/24/2021 07/03/2018   Last edited by: Narda Rutherford, LPN  Stoney Bang H, LPN   5-year risk: 1.6 % 1.5 %   Lifetime risk: 6.1 % 6.7 %            A: [facility] exam with pap smear No complaints.  P: Referred patient to the [facility] for a screening mammogram on mobile unit. Appointment scheduled Thursday, August 24, 2021 at 1040.  Barbarita, [place], RN 08/24/2021 9:16 AM

## 2021-08-28 LAB — CYTOLOGY - PAP
Adequacy: ABSENT
Comment: NEGATIVE
Diagnosis: NEGATIVE
High risk HPV: NEGATIVE

## 2021-09-01 LAB — FECAL OCCULT BLOOD, IMMUNOCHEMICAL: Fecal Occult Bld: NEGATIVE

## 2022-08-01 ENCOUNTER — Other Ambulatory Visit: Payer: Self-pay | Admitting: Obstetrics and Gynecology

## 2022-08-01 DIAGNOSIS — Z1231 Encounter for screening mammogram for malignant neoplasm of breast: Secondary | ICD-10-CM

## 2022-09-11 ENCOUNTER — Ambulatory Visit
Admission: RE | Admit: 2022-09-11 | Discharge: 2022-09-11 | Disposition: A | Discharge status: Home / Self Care | Payer: Medicare Other | Source: Ambulatory Visit | Attending: Obstetrics and Gynecology | Admitting: Obstetrics and Gynecology

## 2022-09-11 DIAGNOSIS — Z1231 Encounter for screening mammogram for malignant neoplasm of breast: Secondary | ICD-10-CM

## 2023-01-07 ENCOUNTER — Encounter (INDEPENDENT_AMBULATORY_CARE_PROVIDER_SITE_OTHER): Payer: Medicare HMO | Admitting: Family Medicine

## 2023-02-07 ENCOUNTER — Encounter (HOSPITAL_COMMUNITY): Payer: Self-pay

## 2023-02-07 ENCOUNTER — Emergency Department (HOSPITAL_COMMUNITY): Payer: Medicare Other

## 2023-02-07 ENCOUNTER — Other Ambulatory Visit: Payer: Self-pay

## 2023-02-07 ENCOUNTER — Emergency Department (HOSPITAL_COMMUNITY)
Admission: EM | Admit: 2023-02-07 | Discharge: 2023-02-07 | Disposition: A | Discharge status: Home / Self Care | Payer: Medicare Other | Attending: Emergency Medicine | Admitting: Emergency Medicine

## 2023-02-07 DIAGNOSIS — Z79899 Other long term (current) drug therapy: Secondary | ICD-10-CM | POA: Diagnosis not present

## 2023-02-07 DIAGNOSIS — R1012 Left upper quadrant pain: Secondary | ICD-10-CM | POA: Diagnosis present

## 2023-02-07 DIAGNOSIS — K5732 Diverticulitis of large intestine without perforation or abscess without bleeding: Secondary | ICD-10-CM | POA: Diagnosis not present

## 2023-02-07 DIAGNOSIS — K5792 Diverticulitis of intestine, part unspecified, without perforation or abscess without bleeding: Secondary | ICD-10-CM

## 2023-02-07 DIAGNOSIS — I10 Essential (primary) hypertension: Secondary | ICD-10-CM | POA: Insufficient documentation

## 2023-02-07 DIAGNOSIS — Z7982 Long term (current) use of aspirin: Secondary | ICD-10-CM | POA: Insufficient documentation

## 2023-02-07 LAB — URINALYSIS, ROUTINE W REFLEX MICROSCOPIC
Bilirubin Urine: NEGATIVE
Glucose, UA: NEGATIVE mg/dL
Hgb urine dipstick: NEGATIVE
Ketones, ur: NEGATIVE mg/dL
Leukocytes,Ua: NEGATIVE
Nitrite: NEGATIVE
Protein, ur: NEGATIVE mg/dL
Specific Gravity, Urine: 1.002 — ABNORMAL LOW (ref 1.005–1.030)
pH: 8 (ref 5.0–8.0)

## 2023-02-07 LAB — COMPREHENSIVE METABOLIC PANEL
ALT: 26 U/L (ref 0–44)
AST: 25 U/L (ref 15–41)
Albumin: 4 g/dL (ref 3.5–5.0)
Alkaline Phosphatase: 72 U/L (ref 38–126)
Anion gap: 8 (ref 5–15)
BUN: 9 mg/dL (ref 8–23)
CO2: 25 mmol/L (ref 22–32)
Calcium: 9.4 mg/dL (ref 8.9–10.3)
Chloride: 105 mmol/L (ref 98–111)
Creatinine, Ser: 0.76 mg/dL (ref 0.44–1.00)
GFR, Estimated: >60 mL/min (ref >60)
Glucose, Bld: 109 mg/dL — ABNORMAL HIGH (ref 70–99)
Potassium: 3.9 mmol/L (ref 3.5–5.1)
Sodium: 138 mmol/L (ref 135–145)
Total Bilirubin: 0.8 mg/dL (ref 0.3–1.2)
Total Protein: 7.9 g/dL (ref 6.5–8.1)

## 2023-02-07 LAB — CBC
HCT: 39.7 % (ref 36.0–46.0)
Hemoglobin: 12.9 g/dL (ref 12.0–15.0)
MCH: 29 pg (ref 26.0–34.0)
MCHC: 32.5 g/dL (ref 30.0–36.0)
MCV: 89.2 fL (ref 80.0–100.0)
Platelets: 361 10*3/uL (ref 150–400)
RBC: 4.45 MIL/uL (ref 3.87–5.11)
RDW: 12.7 % (ref 11.5–15.5)
WBC: 8.4 10*3/uL (ref 4.0–10.5)
nRBC: 0 % (ref 0.0–0.2)

## 2023-02-07 LAB — TROPONIN I (HIGH SENSITIVITY): Troponin I (High Sensitivity): 4 ng/L (ref <18)

## 2023-02-07 LAB — LIPASE, BLOOD: Lipase: 24 U/L (ref 11–51)

## 2023-02-07 MED ORDER — SODIUM CHLORIDE 0.9 % IV BOLUS
1000.0000 mL | Freq: Once | INTRAVENOUS | Status: AC
  Administered 2023-02-07: 1000 mL via INTRAVENOUS

## 2023-02-07 MED ORDER — PIPERACILLIN-TAZOBACTAM 3.375 G IVPB 30 MIN
3.3750 g | Freq: Once | INTRAVENOUS | Status: AC
  Administered 2023-02-07: 3.375 g via INTRAVENOUS
  Filled 2023-02-07: qty 50

## 2023-02-07 MED ORDER — SODIUM CHLORIDE (PF) 0.9 % IJ SOLN
INTRAMUSCULAR | Status: AC
  Filled 2023-02-07: qty 50

## 2023-02-07 MED ORDER — KETOROLAC TROMETHAMINE 30 MG/ML IJ SOLN
30.0000 mg | Freq: Once | INTRAMUSCULAR | Status: AC
  Administered 2023-02-07: 30 mg via INTRAVENOUS
  Filled 2023-02-07: qty 1

## 2023-02-07 MED ORDER — AMOXICILLIN-POT CLAVULANATE 875-125 MG PO TABS: 1.0000 | ORAL_TABLET | Freq: Two times a day (BID) | ORAL | 0 refills | Status: AC

## 2023-02-07 MED ORDER — IOHEXOL 300 MG/ML  SOLN
100.0000 mL | Freq: Once | INTRAMUSCULAR | Status: AC | PRN
  Administered 2023-02-07: 100 mL via INTRAVENOUS

## 2023-02-07 MED ORDER — ONDANSETRON HCL 4 MG/2ML IJ SOLN
4.0000 mg | Freq: Once | INTRAMUSCULAR | Status: AC
  Administered 2023-02-07: 4 mg via INTRAVENOUS
  Filled 2023-02-07: qty 2

## 2023-02-07 MED ORDER — HYDROCODONE-ACETAMINOPHEN 5-325 MG PO TABS: 1.0000 | ORAL_TABLET | Freq: Four times a day (QID) | ORAL | 0 refills | Status: AC | PRN

## 2023-02-07 NOTE — ED Triage Notes (Signed)
Constant LUQ abdominal pain that started 2 days ago. Nausea, coughing up phlegm, but able to keep fruit down today. Pt states she had 2 bowel movements today and has not been constipated.

## 2023-02-07 NOTE — Discharge Instructions (Addendum)
Your CT showed diverticulitis  Please take Augmentin twice daily for a week.  Take Tylenol or Motrin for pain and take Norco for severe pain  You should follow-up with primary care doctor and also GI doctor  Return to ER if you have worse abdominal pain or vomiting or fevers

## 2023-02-07 NOTE — ED Provider Triage Note (Signed)
Emergency Medicine Provider Triage Evaluation Note  Cheyenne Mcbride , a 67 y.o. female  was evaluated in triage.  Pt complains of left mid abdominal pain x 2 days.  Review of Systems  Positive: Abdominal pain Negative: fever  Physical Exam  BP (!) 149/102   Pulse 73   Temp 98.5 F (36.9 C) (Oral)   Resp 18   Ht  (1.651 m)   Wt 100.2 kg   LMP 04/26/2013   SpO2 99%   BMI 36.78 kg/m  Gen:   Awake, no distress   Resp:  Normal effort  MSK:   Moves extremities without difficulty  Other:    Medical Decision Making  Medically screening exam initiated at 2:25 PM.  Appropriate orders placed.  Cheyenne Mcbride was informed that the remainder of the evaluation will be completed by another provider, this initial triage assessment does not replace that evaluation, and the importance of remaining in the ED until their evaluation is complete.     Ma Rings, [place] 02/07/23 1428

## 2023-02-07 NOTE — ED Provider Notes (Signed)
[place] EMERGENCY DEPARTMENT AT [facility] Provider Note   CSN: [id number] Arrival date & time: 02/07/23  1342     History  Chief Complaint  Patient presents with   Abdominal Pain   Nausea    Cheyenne Mcbride is a 67 y.o. female history of hypertension, previous cholecystectomy here presenting with left upper quadrant pain and nausea and cough.  Patient states that for the last 2 to 3 days, she has been having left upper quadrant pain.  She also has some nausea and vomiting.  She states that she could not keep any food down today.  She denies any urinary symptoms.  Denies any fevers.  The history is provided by the patient.       Home Medications Prior to Admission medications   Medication Sig Start Date End Date Taking? Authorizing Provider  acetaminophen (TYLENOL) 500 MG tablet Take 1 tablet (500 mg total) by mouth every 6 (six) hours as needed. Patient taking differently: Take 500 mg by mouth 2 (two) times daily with a meal.  06/28/15   Cheri Fowler, PA-C  Aspirin-Caffeine (BAYER BACK & BODY PAIN EX ST) 500-32.5 MG TABS Take 1 tablet by mouth 2 (two) times daily as needed (pain).     [provider]  atorvastatin (LIPITOR) 10 MG tablet Take 10 mg by mouth at bedtime.     [provider]  Boswellia-Glucosamine-Vit D (GLUCOSAMINE COMPLEX PO) Take 1 capsule by mouth daily.    [provider]  Cyanocobalamin (VITAMIN B-12 PO) Take 1 tablet by mouth daily.    [provider]  hydrochlorothiazide (HYDRODIURIL) 25 MG tablet Take 25 mg by mouth daily.    [provider]  loratadine (CLARITIN) 10 MG tablet Take 1 tablet (10 mg total) by mouth daily. Patient taking differently: Take 10 mg by mouth daily as needed for allergies.  06/28/15   Cheri Fowler, PA-C  Omega-3 Fatty Acids (FISH OIL) 1000 MG CAPS Take 1,000 mg by mouth daily.    [provider]  traMADol (ULTRAM) 50 MG tablet Take 50 mg by mouth every 12  (twelve) hours as needed for moderate pain.    [provider]      Allergies    Patient has no known allergies.    Review of Systems   Review of Systems  Respiratory:  Positive for cough.   Gastrointestinal:  Positive for abdominal pain and vomiting.  All other systems reviewed and are negative.   Physical Exam Updated Vital Signs BP (!) 151/96 (BP Location: Left Arm)   Pulse (!) 59   Temp (!) 97.5 F (36.4 C) (Oral)   Resp 18   Ht  (1.651 m)   Wt 100.2 kg   LMP 04/26/2013   SpO2 99%   BMI 36.78 kg/m  Physical Exam Vitals and nursing note reviewed.  HENT:     Head: Normocephalic.  Eyes:     Extraocular Movements: Extraocular movements intact.     Pupils: Pupils are equal, round, and reactive to light.  Cardiovascular:     Rate and Rhythm: Normal rate and regular rhythm.  Pulmonary:     Effort: Pulmonary effort is normal.     Breath sounds: Normal breath sounds.  Abdominal:     General: Abdomen is flat.     Comments: Mild left upper quadrant tenderness  Skin:    General: Skin is warm.  Neurological:     General: No focal deficit present.     Mental  Status: She is alert.  Psychiatric:        Mood and Affect: Mood normal.        Behavior: Behavior normal.     ED Results / Procedures / Treatments   Labs (all labs ordered are listed, but only abnormal results are displayed) Labs Reviewed  COMPREHENSIVE METABOLIC PANEL - Abnormal; Notable for the following components:      Result Value   Glucose, Bld 109 (*)    All other components within normal limits  URINALYSIS, ROUTINE W REFLEX MICROSCOPIC - Abnormal; Notable for the following components:   Color, Urine COLORLESS (*)    Specific Gravity, Urine 1.002 (*)    All other components within normal limits  LIPASE, BLOOD  CBC    EKG None  Radiology DG Chest Port 1 View  Result Date: 02/07/2023 CLINICAL DATA:  Cough EXAM: PORTABLE CHEST 1 VIEW COMPARISON:  None Available. FINDINGS: The  heart size and mediastinal contours are within normal limits. Both lungs are clear. The visualized skeletal structures are unremarkable. IMPRESSION: No active disease. Electronically Signed   By: Allegra Lai M.D.   On: 02/07/2023 19:08    Procedures Procedures    Medications Ordered in ED Medications  sodium chloride 0.9 % bolus 1,000 mL (1,000 mLs Intravenous New Bag/Given 02/07/23 1846)  ondansetron (ZOFRAN) injection 4 mg (4 mg Intravenous Given 02/07/23 1837)  ketorolac (TORADOL) 30 MG/ML injection 30 mg (30 mg Intravenous Given 02/07/23 1839)    ED Course/ Medical Decision Making/ A&P                             Medical Decision Making [name] is a 67 y.o. female here with left upper quadrant pain.  Consider gastritis versus colitis versus splenomegaly versus gastroenteritis.  Plan to get CBC and CMP and UA and CT abdomen pelvis.  Will give IV fluid and hydrate and reassess  10:24 PM I reviewed patient's labs and independently interpreted imaging study.  Labs showed normal white blood cell count and normal hemoglobin.  UA was unremarkable.  CT showed mild diverticulitis.  Patient was given Zosyn.  Will discharge home with Augmentin and pain meds.  Stable for discharge.   Problems Addressed: Diverticulitis: acute illness or injury  Amount and/or Complexity of Data Reviewed Labs: ordered. Decision-making details documented in ED Course. Radiology: ordered and independent interpretation performed. Decision-making details documented in ED Course. ECG/medicine tests: ordered and independent interpretation performed. Decision-making details documented in ED Course.  Risk Prescription drug management.    Final Clinical Impression(s) / ED Diagnoses Final diagnoses:  None    Rx / DC Orders ED Discharge Orders     None         Charlynne Pander, MD 02/07/23 2224

## 2023-04-05 NOTE — Progress Notes (Signed)
No SDOH needs. Pt has PCP [facility] [place])

## 2023-04-10 ENCOUNTER — Encounter: Payer: Self-pay | Admitting: *Deleted

## 2023-04-10 NOTE — Progress Notes (Signed)
Pt seen at 03/30/23 screening event where her b/p was 141/94. At event pt did not identify any SDOH insecurities and confirmed her PCP is Laureen Ochs, FNP at [facility]. Chart review documentation notes that pt was seen by her PCP on 04/01/23, where her b/p was 130/84 and where SDOH needs were also reviewed. Pt has future appt with her PCP scheduled on 08/13/23. No additional health equity team support indicated at this time.

## 2023-04-27 ENCOUNTER — Emergency Department (HOSPITAL_COMMUNITY)
Admission: EM | Admit: 2023-04-27 | Discharge: 2023-04-27 | Disposition: A | Discharge status: Home / Self Care | Payer: Medicare Other | Attending: Emergency Medicine | Admitting: Emergency Medicine

## 2023-04-27 DIAGNOSIS — N12 Tubulo-interstitial nephritis, not specified as acute or chronic: Secondary | ICD-10-CM | POA: Insufficient documentation

## 2023-04-27 DIAGNOSIS — R300 Dysuria: Secondary | ICD-10-CM | POA: Diagnosis present

## 2023-04-27 DIAGNOSIS — I10 Essential (primary) hypertension: Secondary | ICD-10-CM | POA: Diagnosis not present

## 2023-04-27 LAB — URINALYSIS, ROUTINE W REFLEX MICROSCOPIC
Bacteria, UA: NONE SEEN
Bilirubin Urine: NEGATIVE
Glucose, UA: NEGATIVE mg/dL
Ketones, ur: NEGATIVE mg/dL
Nitrite: NEGATIVE
Protein, ur: 100 mg/dL — AB
RBC / HPF: >50 RBC/hpf (ref 0–5)
Specific Gravity, Urine: 1.012 (ref 1.005–1.030)
WBC, UA: >50 WBC/hpf (ref 0–5)
pH: 6 (ref 5.0–8.0)

## 2023-04-27 MED ORDER — SULFAMETHOXAZOLE-TRIMETHOPRIM 800-160 MG PO TABS: 1.0000 | ORAL_TABLET | Freq: Two times a day (BID) | ORAL | 0 refills | Status: AC

## 2023-04-27 MED ORDER — PHENAZOPYRIDINE HCL 200 MG PO TABS: 200.0000 mg | ORAL_TABLET | Freq: Three times a day (TID) | ORAL | 0 refills | Status: AC

## 2023-04-27 NOTE — ED Triage Notes (Signed)
Patient reports painful urination and frequency x 2 days Says she may have UTI or STD and would like to be tested for both Pain rated 9/10

## 2023-04-27 NOTE — ED Provider Notes (Signed)
Cheyenne Mcbride Provider Note   CSN: [id number] Arrival date & time: 04/27/23  8416     History  Chief Complaint  Patient presents with   Urinary Frequency    Cheyenne Mcbride is a 67 y.o. female with past medical history significant for hypertension and hyperlipidemia presents to the ED complaining of dysuria and urinary frequency for the past 2 days.  She has associated lower abdominal pain and back pain.  She has been drinking cranberry juice and using Goody Powder without much relief of symptoms.  Patient also began treating herself for yeast infection with OTC Monistat.  She reports no new sexual partners, has been with the same person for a year, but was recently sexually active and is requesting STD testing as well.  Denies fever, chills, nausea, vomiting, diarrhea, hematuria, vaginal discharge or bleeding, pelvic pain.        Home Medications Prior to Admission medications   Medication Sig Start Date End Date Taking? Authorizing Provider  phenazopyridine (PYRIDIUM) 200 MG tablet Take 1 tablet (200 mg total) by mouth 3 (three) times daily. 04/27/23  Yes Traver Meckes R, PA-C  sulfamethoxazole-trimethoprim (BACTRIM DS) 800-160 MG tablet Take 1 tablet by mouth 2 (two) times daily for 10 days. 04/27/23 05/07/23 Yes Earlin Sweeden R, PA-C  acetaminophen (TYLENOL) 500 MG tablet Take 1 tablet (500 mg total) by mouth every 6 (six) hours as needed. Patient taking differently: Take 500 mg by mouth 2 (two) times daily with a meal.  06/28/15   Cheri Fowler, PA-C  amoxicillin-clavulanate (AUGMENTIN) 875-125 MG tablet Take 1 tablet by mouth every 12 (twelve) hours. 02/07/23   Charlynne Pander, MD  Aspirin-Caffeine (BAYER BACK & BODY PAIN EX ST) 500-32.5 MG TABS Take 1 tablet by mouth 2 (two) times daily as needed (pain).     [provider]  atorvastatin (LIPITOR) 10 MG tablet Take 10 mg by mouth at bedtime.     [provider]   Boswellia-Glucosamine-Vit D (GLUCOSAMINE COMPLEX PO) Take 1 capsule by mouth daily.    [provider]  Cyanocobalamin (VITAMIN B-12 PO) Take 1 tablet by mouth daily.    [provider]  hydrochlorothiazide (HYDRODIURIL) 25 MG tablet Take 25 mg by mouth daily.    [provider]  HYDROcodone-acetaminophen (NORCO/VICODIN) 5-325 MG tablet Take 1 tablet by mouth every 6 (six) hours as needed. 02/07/23   Charlynne Pander, MD  loratadine (CLARITIN) 10 MG tablet Take 1 tablet (10 mg total) by mouth daily. Patient taking differently: Take 10 mg by mouth daily as needed for allergies.  06/28/15   Cheri Fowler, PA-C  Omega-3 Fatty Acids (FISH OIL) 1000 MG CAPS Take 1,000 mg by mouth daily.    [provider]  traMADol (ULTRAM) 50 MG tablet Take 50 mg by mouth every 12 (twelve) hours as needed for moderate pain.    [provider]      Allergies    Patient has no known allergies.    Review of Systems   Review of Systems  Constitutional:  Negative for fever.  Gastrointestinal:  Positive for abdominal pain. Negative for diarrhea, nausea and vomiting.  Genitourinary:  Positive for dysuria, flank pain, frequency and urgency. Negative for difficulty urinating, pelvic pain, vaginal bleeding and vaginal discharge.    Physical Exam Updated Vital Signs BP 135/82 (BP Location: Left Arm)   Pulse 84   Temp 99.8 F (37.7 C) (Oral)   Resp 17   Ht  5\' 5"  (1.651 m)   Wt 99.8 kg   LMP 04/26/2013   SpO2 98%   BMI 36.61 kg/m  Physical Exam Vitals and nursing note reviewed.  Constitutional:      General: She is not in acute distress.    Appearance: Normal appearance. She is not ill-appearing or diaphoretic.  Cardiovascular:     Rate and Rhythm: Normal rate and regular rhythm.  Pulmonary:     Effort: Pulmonary effort is normal.  Abdominal:     General: Abdomen is flat.     Palpations: Abdomen is soft.     Tenderness: There is abdominal tenderness in the right  lower quadrant, suprapubic area and left lower quadrant. There is right CVA tenderness. There is no left CVA tenderness.  Skin:    General: Skin is warm and dry.     Capillary Refill: Capillary refill takes less than 2 seconds.  Neurological:     Mental Status: She is alert. Mental status is at baseline.  Psychiatric:        Mood and Affect: Mood normal.        Behavior: Behavior normal.     ED Results / Procedures / Treatments   Labs (all labs ordered are listed, but only abnormal results are displayed) Labs Reviewed  URINALYSIS, ROUTINE W REFLEX MICROSCOPIC - Abnormal; Notable for the following components:      Result Value   APPearance TURBID (*)    Hgb urine dipstick LARGE (*)    Protein, ur 100 (*)    Leukocytes,Ua LARGE (*)    All other components within normal limits  URINE CULTURE  GC/CHLAMYDIA PROBE AMP ([place]) NOT AT [facility]    EKG None  Radiology No results found.  Procedures Procedures    Medications Ordered in ED Medications - No data to display  ED Course/ Medical Decision Making/ A&P                             Medical Decision Making Amount and/or Complexity of Data Reviewed Labs: ordered.  Risk Prescription drug management.   This patient presents to the ED with chief complaint(s) of dysuria, urinary frequency and urgency with non-contributory past medical history.  The complaint involves an extensive differential diagnosis and also carries with it a high risk of complications and morbidity.    The differential diagnosis includes acute cystitis/UTI, pyelonephritis, GC, chlamydia, yeast infection   The initial plan is to obtain UA, GC/chlamydia swab (patient will self swab)  Initial Assessment:   Exam significant for generalized lower abdominal tenderness without distension.  R CVA tenderness present.  Skin is warm and dry.  Patient is overall well-appearing.  Vitals are stable.   Independent ECG/labs interpretation:  The following labs  were independently interpreted:  UA with significant pyuria, no bacteria present.  There is also microscopic hematuria.  GC/chlamydia collected, pending.  Urine sent for culture.    Disposition:   Will treat patient for suspected pyelonephritis given workup and physical exam findings.  Patient to be notified of pending results.  Urine culture sent, treatment will be modified if needed.  Pyridium also sent to pharmacy for dysuria.  The patient has been appropriately medically screened and/or stabilized in the ED. I have low suspicion for any other emergent medical condition which would require further screening, evaluation or treatment in the ED or require inpatient management. At time of discharge the patient is hemodynamically stable and in no  acute distress. I have discussed work-up results and diagnosis with patient and answered all questions. Patient is agreeable with discharge plan. We discussed strict return precautions for returning to the emergency department and they verbalized understanding.            Final Clinical Impression(s) / ED Diagnoses Final diagnoses:  Pyelonephritis    Rx / DC Orders ED Discharge Orders          Ordered    phenazopyridine (PYRIDIUM) 200 MG tablet  3 times daily        04/27/23 1018    sulfamethoxazole-trimethoprim (BACTRIM DS) 800-160 MG tablet  2 times daily        04/27/23 [address], PA-C 04/27/23 1041    Jacalyn Lefevre, MD 04/27/23 1533

## 2023-04-27 NOTE — ED Notes (Signed)
Called Lab to add urine culture to previous urine sample.

## 2023-04-27 NOTE — Discharge Instructions (Addendum)
Thank you for allowing [place] to be a part of your care today.  You were evaluated in the ED for painful and frequent urination.   I am sending you home on 10 days of an antibiotic to cover for kidney infection.  Please take this for the entire course, even if symptoms begin to improve unless you are contacted and treatment needs to be changed due to 1) urine culture results or 2) positive GC/chlamydia testing.   I have also sent in a prescription for Pyridium to help with painful urination.  Increase your water intake throughout the day.  You may also continue to drink cranberry juice.  Limit the amount of sodas you are consuming.   You will be notified of pending test results via MyChart.   Return to the ED if you develop sudden worsening of your symptoms or if you have any new concerns.

## 2023-04-28 LAB — URINE CULTURE

## 2023-04-29 LAB — GC/CHLAMYDIA PROBE AMP (~~LOC~~) NOT AT ARMC
Chlamydia: NEGATIVE
Comment: NEGATIVE
Comment: NORMAL
Neisseria Gonorrhea: NEGATIVE

## 2023-04-29 LAB — URINE CULTURE: Culture: >=100000 — AB

## 2023-04-30 ENCOUNTER — Telehealth (HOSPITAL_BASED_OUTPATIENT_CLINIC_OR_DEPARTMENT_OTHER): Payer: Self-pay | Admitting: *Deleted

## 2023-04-30 NOTE — Telephone Encounter (Signed)
Post ED Visit - Positive Culture Follow-up  Culture report reviewed by antimicrobial stewardship pharmacist: [facility] []  Len Childs, PharmD []  Enzo Bi, Pharm.D. []  Celedonio Miyamoto, Pharm.D., BCPS AQ-ID []  Garvin Fila, Pharm.D., BCPS [x]  Georgina Pillion, Pharm.D., BCPS []  [employer], [address].D., BCPS, AAHIVP []  Estella Husk, Pharm.D., BCPS, AAHIVP []  Lysle Pearl, PharmD, BCPS []  Phillips Climes, PharmD, BCPS []  Agapito Games, PharmD, BCPS []  Verlan Friends, PharmD []  Mervyn Gay, PharmD, BCPS []  Vinnie Level, PharmD  Positive Urine culture Treated with sulfamethoxazole-trimethoprim, organism sensitive to the same and no further patient follow-up is required at this time.  Nena Polio Garner Nash 04/30/2023, 9:25 AM

## 2023-07-18 ENCOUNTER — Other Ambulatory Visit: Payer: Self-pay | Admitting: Nurse Practitioner

## 2023-07-18 DIAGNOSIS — Z1231 Encounter for screening mammogram for malignant neoplasm of breast: Secondary | ICD-10-CM

## 2023-09-16 ENCOUNTER — Ambulatory Visit: Payer: Medicare Other

## 2023-10-19 IMAGING — MG MM DIGITAL SCREENING BILAT W/ TOMO AND CAD
8 series · 8 of 24 positions shown · non-contrast
Comparison: Previous exam(s).

CLINICAL DATA: Screening.

EXAM:
DIGITAL SCREENING BILATERAL MAMMOGRAM WITH TOMOSYNTHESIS AND CAD
TECHNIQUE: Bilateral screening digital craniocaudal and mediolateral oblique
mammograms were obtained. Bilateral screening digital breast
tomosynthesis was performed. The images were evaluated with
computer-aided detection.

[L MLO synth-2D]
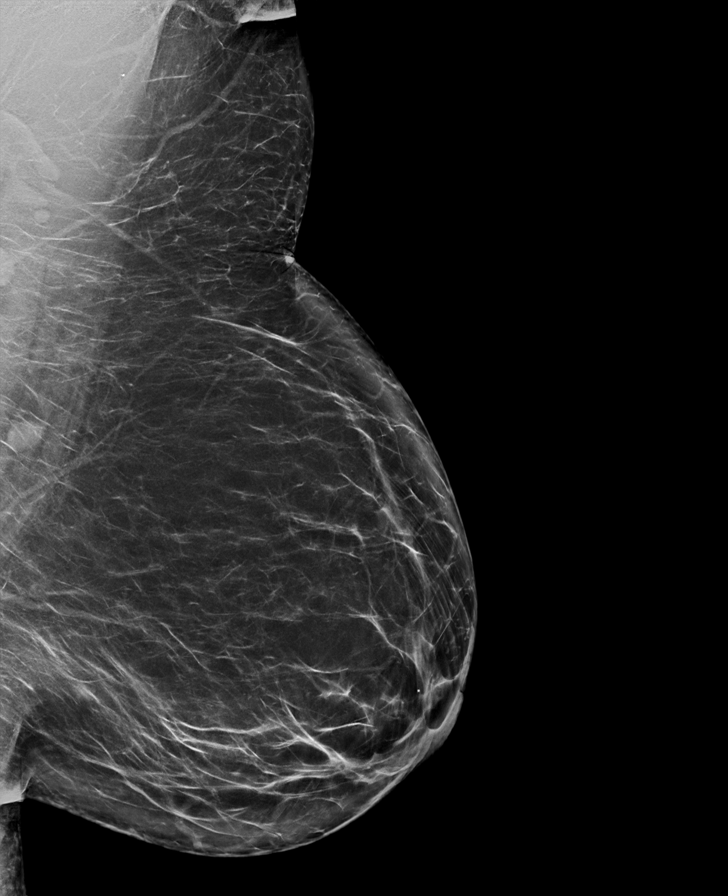

[R MLO synth-2D]
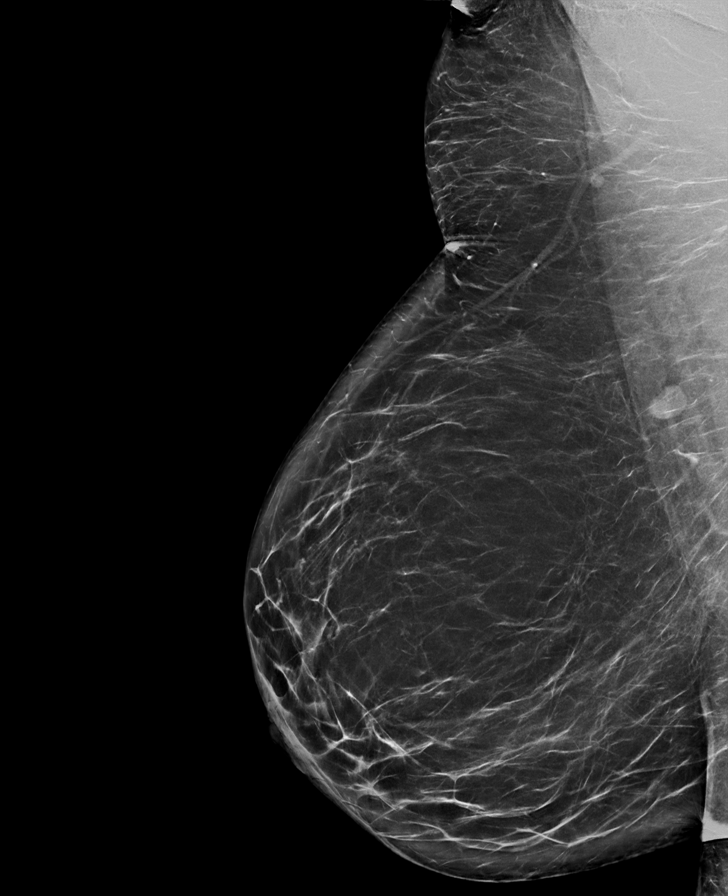

[L CC synth-2D]
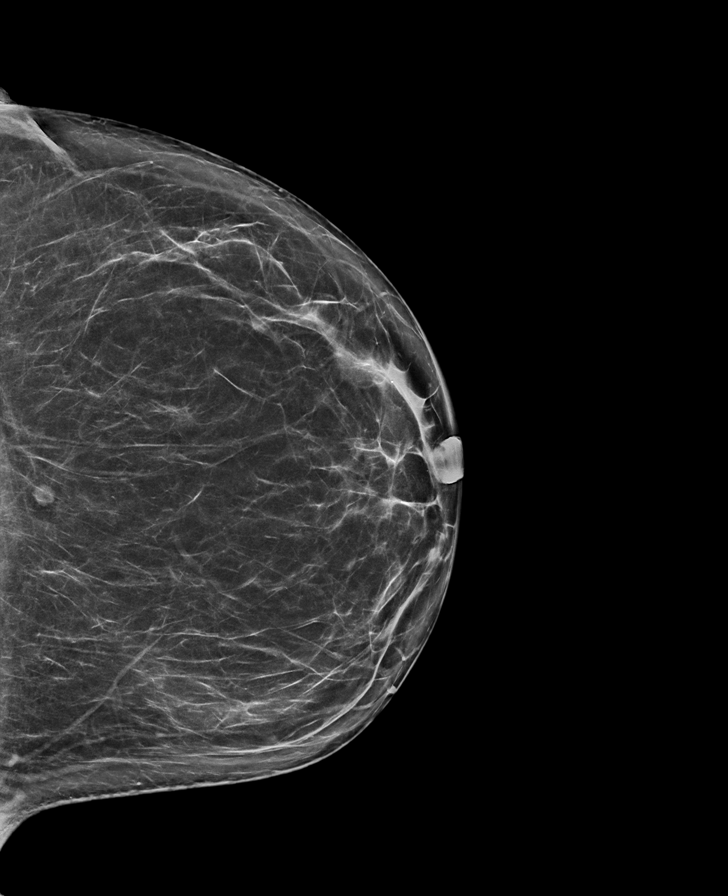

[R CC synth-2D]
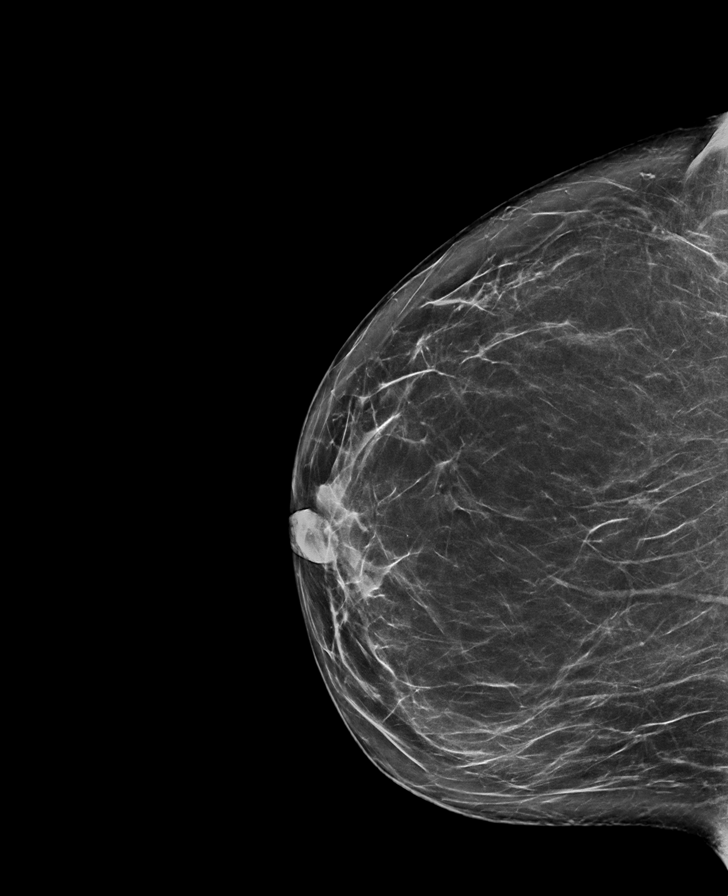

[R MLO tomo · tomo slice 47/93.0]
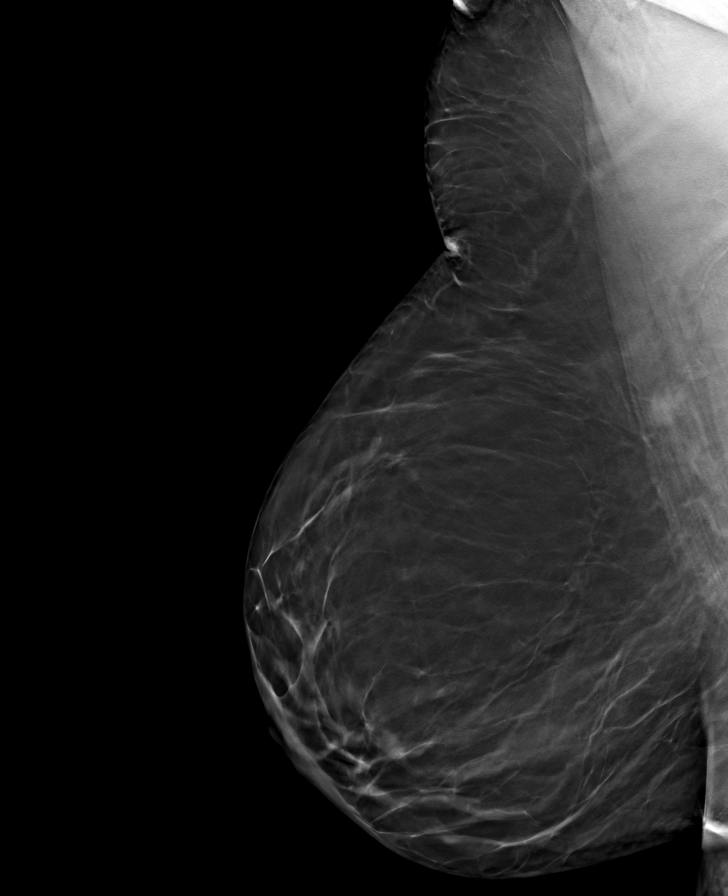

[R CC tomo · tomo slice 41/81.0]
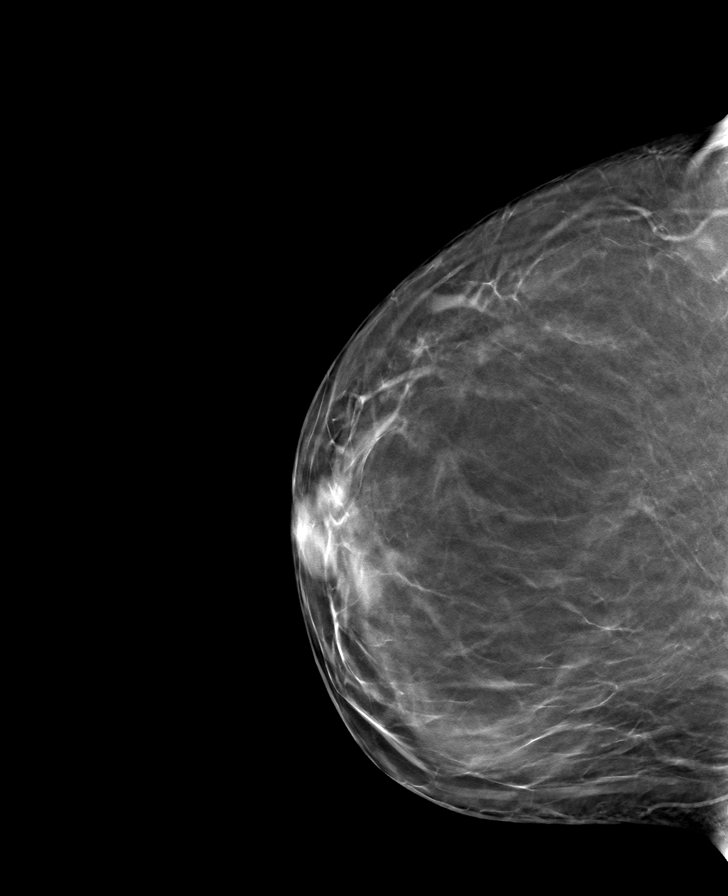

[L CC tomo · tomo slice 39/78.0]
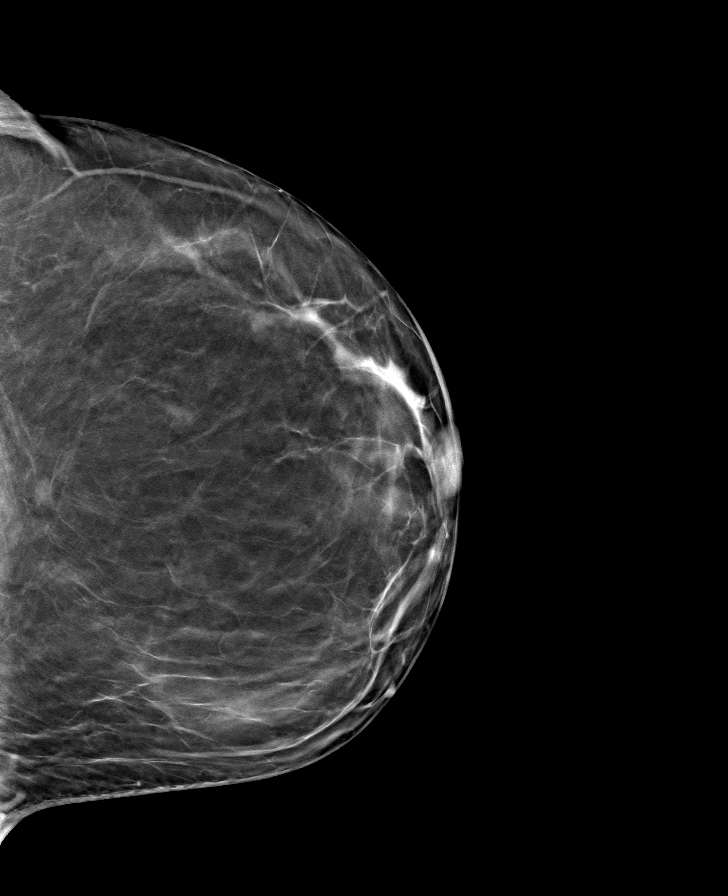

[L MLO tomo · tomo slice 47/93.0]
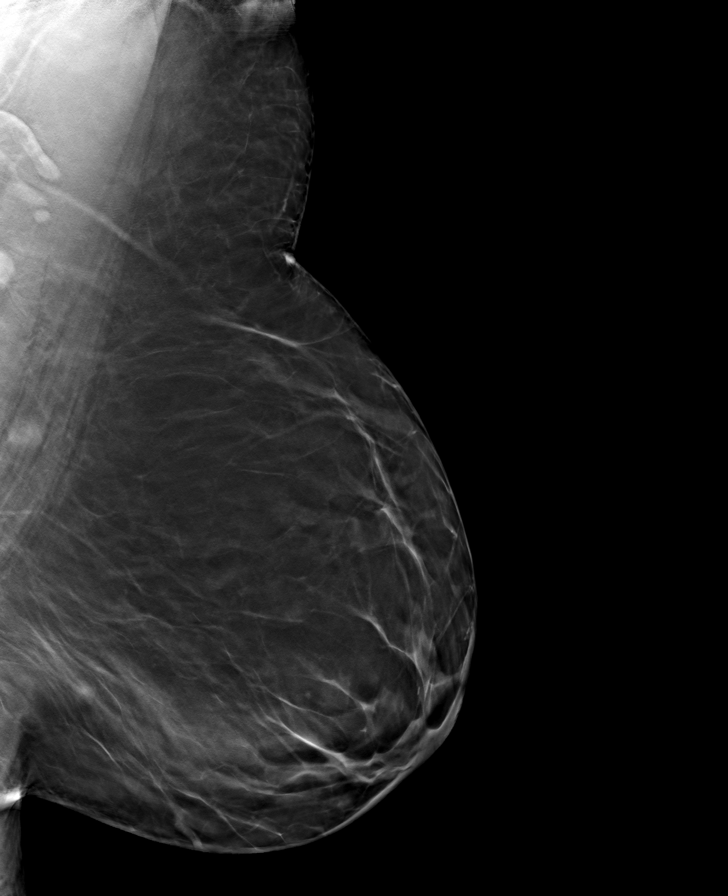

[8 of 24 positions shown; findings below may reference images not displayed]

ACR Breast Density Category b: There are scattered areas of
fibroglandular density.
FINDINGS: There are no findings suspicious for malignancy.
IMPRESSION: No mammographic evidence of malignancy. A result letter of this
screening mammogram will be mailed directly to the patient.

RECOMMENDATION:
Screening mammogram in one year. (Code:51-O-LD2)

BI-RADS CATEGORY  1: Negative.

## 2023-10-24 ENCOUNTER — Ambulatory Visit: Payer: Self-pay

## 2023-11-07 ENCOUNTER — Ambulatory Visit
Admission: RE | Admit: 2023-11-07 | Discharge: 2023-11-07 | Disposition: A | Discharge status: Home / Self Care | Payer: Medicare (Managed Care) | Source: Ambulatory Visit | Attending: Nurse Practitioner | Admitting: Nurse Practitioner

## 2023-11-07 DIAGNOSIS — Z1231 Encounter for screening mammogram for malignant neoplasm of breast: Secondary | ICD-10-CM

## 2024-09-08 NOTE — Progress Notes (Unsigned)
 Ben Jackson D[facility] [address] [place] [address] Phone: [phone]   Assessment and Plan:     1. Greater trochanteric bursitis of right hip (Primary) -Acute, initial visit - Most consistent with right greater trochanteric bursitis likely flared due to altered mechanics with recent ankle pain that is resolving - Recommend CSI to greater trochanter.  Tolerated well per note below -Start HEP for gluteal musculature -May use leftover naproxen 500 mg daily as needed for pain relief.  Recommend limiting chronic NSAIDs to 1 dose per week - May use leftover Percocet as needed for severe breakthrough pain.  Recommend limiting chronic opioid medications  Procedure: Greater trochanteric bursal injection Side: Right  Risks explained and consent was given verbally. The site was cleaned with alcohol prep. A steroid injection was performed with patient in the lateral side-lying position at area of maximum tenderness over greater trochanter using 2mL of 1% lidocaine without epinephrine and 1mL of kenalog 40mg /ml. This was well tolerated.  Needle was removed, hemostasis achieved, and post injection instructions were explained.  Pt was advised to call or return to clinic if these symptoms worsen or fail to improve as anticipated.    15 additional minutes spent for educating Therapeutic Home Exercise Program.  This included exercises focusing on stretching, strengthening, with focus on eccentric aspects.   Long term goals include an improvement in range of motion, strength, endurance as well as avoiding reinjury. Patient's frequency would include in 1-2 times a day, 3-5 times a week for a duration of 6-12 weeks. Proper technique shown and discussed handout in great detail with ATC.  All questions were discussed and answered.    Pertinent previous records reviewed include CT abdomen pelvis   Follow Up: 4 weeks for reevaluation.  If no improvement or  worsening of symptoms, could consider lumbar etiology with degenerative changes seen on prior CT abdomen pelvis causing radicular symptoms   Subjective:   I, Cheyenne Mcbride, am serving as a neurosurgeon for Doctor Morene Mace  Chief Complaint: hip pain   HPI:   09/10/2024 Patient is a 68 year old female with hip pain. Patient states right hip pain for about 3 weeks. Thinks she bumped her leg on something and pain went up and down her whole leg. Was taking tylenol . Burning sensation. RICES have helped some. Uses a heating pad to sleep. Naproxen has helped a little. She is not able to work out due to pain. No numbness but does have tingling. Decreased ROM    Relevant Historical Information: None pertinent  Additional pertinent review of systems negative.   Current Outpatient Medications:    amoxicillin -clavulanate (AUGMENTIN ) 875-125 MG tablet, Take 1 tablet by mouth every 12 (twelve) hours., Disp: 14 tablet, Rfl: 0   atorvastatin (LIPITOR) 10 MG tablet, Take 10 mg by mouth at bedtime. , Disp: , Rfl:    Boswellia-Glucosamine-Vit D (GLUCOSAMINE COMPLEX PO), Take 1 capsule by mouth daily., Disp: , Rfl:    hydrochlorothiazide (HYDRODIURIL) 25 MG tablet, Take 25 mg by mouth daily., Disp: , Rfl:    HYDROcodone -acetaminophen  (NORCO/VICODIN) 5-325 MG tablet, Take 1 tablet by mouth every 6 (six) hours as needed., Disp: 10 tablet, Rfl: 0   loratadine  (CLARITIN ) 10 MG tablet, Take 1 tablet (10 mg total) by mouth daily. (Patient taking differently: Take 10 mg by mouth daily as needed for allergies. ), Disp: 10 tablet, Rfl: 0   phenazopyridine  (PYRIDIUM ) 200 MG tablet, Take 1 tablet (200 mg total) by  mouth 3 (three) times daily., Disp: 6 tablet, Rfl: 0   Objective:     Vitals:   09/10/24 1429  Pulse: 65  SpO2: 100%  Weight: 220 lb (99.8 kg)  Height: 5' 5 (1.651 m)      Body mass index is 36.61 kg/m.    Physical Exam:    General: awake, alert, and oriented no acute distress,  nontoxic Skin: no suspicious lesions or rashes Neuro:sensation intact distally with no deficits, normal muscle tone, no atrophy, strength 5/5 in all tested lower ext groups Psych: normal mood and affect, speech clear   Right hip: No deformity, swelling or wasting ROM Flexion 90, ext 30, IR 45, ER 45 TTP greater trochanter, gluteal musculature NTTP over the hip flexors, si joint, lumbar spine Negative log roll with FROM Negative FABER Negative FADIR Negative Piriformis test Negative trendelenberg Gait normal    Electronically signed by:  [name] 4:09 PM 09/10/24

## 2024-09-10 ENCOUNTER — Ambulatory Visit: Payer: Medicare (Managed Care) | Admitting: Sports Medicine

## 2024-09-10 VITALS — HR 65 | Ht 65.0 in | Wt 220.0 lb

## 2024-09-10 DIAGNOSIS — M7061 Trochanteric bursitis, right hip: Secondary | ICD-10-CM | POA: Diagnosis not present

## 2024-09-10 NOTE — Patient Instructions (Signed)
 Hip HEP   Naproxen as needed   Percocet as needed for breakthrough limit use   4 week follow up

## 2024-09-23 ENCOUNTER — Encounter: Payer: Self-pay | Admitting: Family

## 2024-09-23 DIAGNOSIS — Z1231 Encounter for screening mammogram for malignant neoplasm of breast: Secondary | ICD-10-CM

## 2024-10-07 NOTE — Progress Notes (Unsigned)
 Ben Jackson D[facility] [address] [place] [address] Phone: [phone]   Assessment and Plan:     1. 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  Symptoms originally thought to be consistent with greater trochanteric bursitis, however patient had no relief with greater trochanteric CSI, HEP for gluteal musculature - Recommend lumbar MRI due to recurrent back pain off and on for years not improving with conservative therapy for >6 weeks, pain with day-to-day activity, pain >6/10, degenerative changes seen on x-rays and CT scans -Naproxen 500 mg twice daily x2 weeks.  If still having pain after 2 weeks, complete 3rd-week of NSAID. May use remaining NSAID as needed once daily for pain control.  Do not to use additional over-the-counter NSAIDs (ibuprofen , naproxen, Advil , Aleve, etc.) while taking prescription NSAIDs.  May use Tylenol  [phone] mg 2 to 3 times a day for breakthrough pain.      Pertinent previous records reviewed include CT abdomen pelvis   Follow Up: 1 week after MRI.  Could consider epidural CSI if appropriate based on imaging   Subjective:   I, Cheyenne Mcbride, am serving as a neurosurgeon for Cheyenne Mcbride   Chief Complaint: hip pain    HPI:    09/10/2024 Patient is a 68 year old female with hip pain. Patient states right hip pain for about 3 weeks. Thinks she bumped her leg on something and pain went up and down her whole leg. Was taking tylenol . Burning sensation. RICES have helped some. Uses a heating pad to sleep. Naproxen has helped a little. She is not able to work out due to pain. No numbness but does have tingling. Decreased ROM    10/08/2024 Patient states she is the same   Relevant Historical Information: None pertinent  Additional  pertinent review of systems negative.  Current Medications[1]   Objective:     Vitals:   10/08/24 1424  BP: 118/84  Pulse: 82  SpO2: 98%  Weight: 219 lb (99.3 kg)  Height: 5' 5 (1.651 m)      Body mass index is 36.44 kg/m.    Physical Exam:    General: awake, alert, and oriented no acute distress, nontoxic Skin: no suspicious lesions or rashes Neuro:sensation intact distally with no deficits, normal muscle tone, no atrophy, strength 5/5 in all tested lower ext groups Psych: normal mood and affect, speech clear   Right hip: No deformity, swelling or wasting ROM Flexion 90, ext 30, IR 45, ER 45 TTP greater trochanter, gluteal musculature NTTP over the hip flexors, Negative log roll with FROM Negative FABER Negative FADIR Negative Piriformis test Negative trendelenberg Gait normal   Back - Normal skin, Spine with normal alignment and no deformity.   No tenderness to vertebral process palpation.   Right lumbar paraspinous muscles are tender and without spasm Straight leg raise positive right   Electronically signed by:  Odis Mcbride D[facility] 2:38 PM 10/08/2024     [1]  Current Outpatient Medications:    amoxicillin -clavulanate (AUGMENTIN ) 875-125 MG tablet, Take 1 tablet by mouth every 12 (twelve) hours., Disp: 14 tablet, Rfl: 0   atorvastatin (LIPITOR) 10 MG tablet, Take 10 mg by mouth at bedtime. , Disp: , Rfl:    Boswellia-Glucosamine-Vit D (GLUCOSAMINE COMPLEX PO), Take 1 capsule by mouth daily., Disp: , Rfl:  hydrochlorothiazide (HYDRODIURIL) 25 MG tablet, Take 25 mg by mouth daily., Disp: , Rfl:    HYDROcodone -acetaminophen  (NORCO/VICODIN) 5-325 MG tablet, Take 1 tablet by mouth every 6 (six) hours as needed., Disp: 10 tablet, Rfl: 0   loratadine  (CLARITIN ) 10 MG tablet, Take 1 tablet (10 mg total) by mouth daily. (Patient taking differently: Take 10 mg by mouth daily as needed for allergies. ), Disp: 10 tablet, Rfl: 0    phenazopyridine  (PYRIDIUM ) 200 MG tablet, Take 1 tablet (200 mg total) by mouth 3 (three) times daily., Disp: 6 tablet, Rfl: 0

## 2024-10-08 ENCOUNTER — Ambulatory Visit: Payer: Medicare (Managed Care) | Admitting: Sports Medicine

## 2024-10-08 VITALS — BP 118/84 | HR 82 | Ht 65.0 in | Wt 219.0 lb

## 2024-10-08 DIAGNOSIS — G8929 Other chronic pain: Secondary | ICD-10-CM

## 2024-10-08 DIAGNOSIS — M5441 Lumbago with sciatica, right side: Secondary | ICD-10-CM

## 2024-10-08 DIAGNOSIS — M79604 Pain in right leg: Secondary | ICD-10-CM | POA: Diagnosis not present

## 2024-10-08 NOTE — Patient Instructions (Signed)
 MRI referral   - Start naproxen 500 mg 2x daily  x2 weeks.  If still having pain after 2 weeks, complete 3rd-week of NSAID. May use remaining NSAID as needed once daily for pain control.  Do not to use additional over-the-counter NSAIDs (ibuprofen , naproxen, Advil , Aleve, etc.) while taking prescription NSAIDs.  May use Tylenol  [phone] mg 2 to 3 times a day for breakthrough pain.  Follow up 1 week after MRI to discuss results

## 2024-10-16 ENCOUNTER — Other Ambulatory Visit: Payer: Self-pay | Admitting: Family

## 2024-10-16 DIAGNOSIS — Z1231 Encounter for screening mammogram for malignant neoplasm of breast: Secondary | ICD-10-CM

## 2024-10-26 ENCOUNTER — Encounter: Payer: Self-pay | Admitting: Sports Medicine

## 2024-10-29 ENCOUNTER — Other Ambulatory Visit: Payer: Medicare (Managed Care)

## 2024-11-02 ENCOUNTER — Encounter: Payer: Self-pay | Admitting: Sports Medicine

## 2024-11-05 ENCOUNTER — Inpatient Hospital Stay: Admission: RE | Admit: 2024-11-05 | Discharge: 2024-11-05 | Payer: Medicare (Managed Care) | Attending: Sports Medicine

## 2024-11-05 DIAGNOSIS — M79604 Pain in right leg: Secondary | ICD-10-CM

## 2024-11-05 DIAGNOSIS — G8929 Other chronic pain: Secondary | ICD-10-CM

## 2024-11-10 ENCOUNTER — Ambulatory Visit: Payer: Self-pay | Admitting: Sports Medicine

## 2024-11-11 ENCOUNTER — Ambulatory Visit: Payer: Medicare (Managed Care)

## 2024-11-17 ENCOUNTER — Ambulatory Visit
Admission: RE | Admit: 2024-11-17 | Discharge: 2024-11-17 | Disposition: A | Payer: Medicare (Managed Care) | Source: Ambulatory Visit | Attending: Family | Admitting: Family

## 2024-11-17 DIAGNOSIS — Z1231 Encounter for screening mammogram for malignant neoplasm of breast: Secondary | ICD-10-CM

## 2024-11-17 NOTE — Progress Notes (Unsigned)
 "               [facility] [address] [place] [address] Phone: [phone]   Assessment and Plan:     1. 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 Spinal stenosis of lumbar region with neurogenic claudication -Chronic with exacerbation, subsequent visit - Continued right posterior hip, lateral hip, leg pain most consistent with degenerative changes from lumbar spine causing radiculopathy.  Patient had no significant improvement after greater trochanteric CSI, HEP for gluteal musculature. - Reviewed patient's lumbar MRI which showed multiple areas of degenerative change most severe at L3-L4 and L4-L5 - I believe patient's symptomatic level is most likely right sided L4-L5 based on physical exam, imaging.  Secondary option would be L3-L4.  Recommend epidural CSI to right sided L4-L5. - Use naproxen 500 mg daily as needed for breakthrough pain.  Recommend limiting chronic NSAIDs to 1-2 doses per week to prevent long-term side effects. Use Tylenol  500 to 1000 mg tablets 2-3 times a day as needed for day-to-day pain relief.       Pertinent previous records reviewed include lumbar MRI 11/05/2024 IMPRESSION: 1. Multilevel degenerative change, worst at L3-L4 and L4-L5 with moderate spinal canal stenosis. 2. L5-S1 severe left neural foraminal stenosis. 3. L3-L4 moderate spinal canal stenosis and moderate bilateral neural foraminal stenosis. 4. L4-L5 moderate spinal canal stenosis and moderate right neural foraminal stenosis. 5. T12-L1 moderate right neural foraminal stenosis.     Follow Up: 2 weeks after epidural to review benefit.  Could consider additional epidural if necessary.  If improving, could consider starting physical therapy to decrease chances of recurrence and to aid patient in returning to gym activities   Subjective:   I, Cheyenne Mcbride, am serving as a neurosurgeon for Doctor Morene Mace   Chief  Complaint: hip pain    HPI:    09/10/2024 Patient is a 69 year old female with hip pain. Patient states right hip pain for about 3 weeks. Thinks she bumped her leg on something and pain went up and down her whole leg. Was taking tylenol . Burning sensation. RICES have helped some. Uses a heating pad to sleep. Naproxen has helped a little. She is not able to work out due to pain. No numbness but does have tingling. Decreased ROM    10/08/2024 Patient states she is the same   11/18/2024 Patient states she is the same. Pain is radiating on the right side    Relevant Historical Information: None pertinent  Additional pertinent review of systems negative.  Current Medications[1]   Objective:     Vitals:   11/18/24 0934  BP: 138/88  Pulse: 91  SpO2: 97%  Weight: 220 lb (99.8 kg)  Height: 5' 5 (1.651 m)      Body mass index is 36.61 kg/m.    Physical Exam:    General: awake, alert, and oriented no acute distress, nontoxic Skin: no suspicious lesions or rashes Neuro:sensation intact distally with no deficits, normal muscle tone, no atrophy, strength 5/5 in all tested lower ext groups Psych: normal mood and affect, speech clear   Right hip: No deformity, swelling or wasting ROM Flexion 90, ext 30, IR 45, ER 45 TTP greater trochanter, gluteal musculature NTTP over the hip flexors, Negative log roll with FROM Negative FABER Negative FADIR Negative Piriformis test Negative trendelenberg Gait normal    Back - Normal skin, Spine with normal alignment and no deformity.  No tenderness to vertebral process palpation.   Right lumbar paraspinous muscles are tender and without spasm Straight leg raise positive right      Electronically signed by:  [facility] 10:09 AM 11/18/24     [1]  Current Outpatient Medications:    amoxicillin -clavulanate (AUGMENTIN ) 875-125 MG tablet, Take 1 tablet by mouth every 12 (twelve) hours., Disp: 14  tablet, Rfl: 0   atorvastatin (LIPITOR) 10 MG tablet, Take 10 mg by mouth at bedtime. , Disp: , Rfl:    Boswellia-Glucosamine-Vit D (GLUCOSAMINE COMPLEX PO), Take 1 capsule by mouth daily., Disp: , Rfl:    hydrochlorothiazide (HYDRODIURIL) 25 MG tablet, Take 25 mg by mouth daily., Disp: , Rfl:    HYDROcodone -acetaminophen  (NORCO/VICODIN) 5-325 MG tablet, Take 1 tablet by mouth every 6 (six) hours as needed., Disp: 10 tablet, Rfl: 0   loratadine  (CLARITIN ) 10 MG tablet, Take 1 tablet (10 mg total) by mouth daily. (Patient taking differently: Take 10 mg by mouth daily as needed for allergies. ), Disp: 10 tablet, Rfl: 0   phenazopyridine  (PYRIDIUM ) 200 MG tablet, Take 1 tablet (200 mg total) by mouth 3 (three) times daily., Disp: 6 tablet, Rfl: 0  "

## 2024-11-18 ENCOUNTER — Ambulatory Visit: Payer: Medicare (Managed Care) | Admitting: Sports Medicine

## 2024-11-18 VITALS — BP 138/88 | HR 91 | Ht 65.0 in | Wt 220.0 lb

## 2024-11-18 DIAGNOSIS — M5441 Lumbago with sciatica, right side: Secondary | ICD-10-CM

## 2024-11-18 DIAGNOSIS — M48062 Spinal stenosis, lumbar region with neurogenic claudication: Secondary | ICD-10-CM

## 2024-11-18 DIAGNOSIS — G8929 Other chronic pain: Secondary | ICD-10-CM

## 2024-11-18 NOTE — Patient Instructions (Signed)
 Epidural L4-5 right   Follow up 2 weeks after to discuss results

## 2024-11-27 ENCOUNTER — Encounter: Payer: Self-pay | Admitting: Sports Medicine

## 2024-11-27 NOTE — Discharge Instructions (Signed)

## 2024-11-30 ENCOUNTER — Inpatient Hospital Stay: Admission: RE | Admit: 2024-11-30 | Payer: Medicare (Managed Care)
